# Patient Record
Sex: Female | Born: 1960 | Race: Black or African American | Hispanic: No | Marital: Single | Smoking: Current some day smoker
Health system: Southern US, Community
[De-identification: ages and names within clinical notes are randomized; demographics above are authoritative.]

## PROBLEM LIST (undated history)

## (undated) DIAGNOSIS — E079 Disorder of thyroid, unspecified: Secondary | ICD-10-CM

## (undated) DIAGNOSIS — I639 Cerebral infarction, unspecified: Secondary | ICD-10-CM

## (undated) DIAGNOSIS — R079 Chest pain, unspecified: Secondary | ICD-10-CM

## (undated) DIAGNOSIS — F319 Bipolar disorder, unspecified: Secondary | ICD-10-CM

## (undated) DIAGNOSIS — F4310 Post-traumatic stress disorder, unspecified: Secondary | ICD-10-CM

## (undated) DIAGNOSIS — I10 Essential (primary) hypertension: Secondary | ICD-10-CM

## (undated) DIAGNOSIS — K219 Gastro-esophageal reflux disease without esophagitis: Secondary | ICD-10-CM

## (undated) DIAGNOSIS — C801 Malignant (primary) neoplasm, unspecified: Secondary | ICD-10-CM

## (undated) DIAGNOSIS — F329 Major depressive disorder, single episode, unspecified: Secondary | ICD-10-CM

## (undated) DIAGNOSIS — F32A Depression, unspecified: Secondary | ICD-10-CM

## (undated) HISTORY — DX: Malignant (primary) neoplasm, unspecified: C80.1

## (undated) HISTORY — PX: TUBAL LIGATION: SHX77

## (undated) HISTORY — DX: Essential (primary) hypertension: I10

## (undated) HISTORY — DX: Gastro-esophageal reflux disease without esophagitis: K21.9

## (undated) HISTORY — PX: SALPINGOOPHORECTOMY: SHX82

## (undated) HISTORY — DX: Chest pain, unspecified: R07.9

## (undated) HISTORY — DX: Disorder of thyroid, unspecified: E07.9

---

## 2005-05-28 DIAGNOSIS — I639 Cerebral infarction, unspecified: Secondary | ICD-10-CM

## 2005-05-28 HISTORY — DX: Cerebral infarction, unspecified: I63.9

## 2006-01-15 ENCOUNTER — Encounter: Payer: Self-pay | Admitting: Internal Medicine

## 2006-01-15 ENCOUNTER — Inpatient Hospital Stay (HOSPITAL_COMMUNITY): Admission: EM | Admit: 2006-01-15 | Discharge: 2006-01-17 | Payer: Self-pay | Admitting: Emergency Medicine

## 2006-01-15 ENCOUNTER — Ambulatory Visit: Payer: Self-pay | Admitting: Internal Medicine

## 2006-01-21 ENCOUNTER — Ambulatory Visit: Payer: Self-pay | Admitting: Internal Medicine

## 2006-01-23 ENCOUNTER — Encounter: Admission: RE | Admit: 2006-01-23 | Discharge: 2006-04-16 | Payer: Self-pay | Admitting: Internal Medicine

## 2006-01-24 ENCOUNTER — Ambulatory Visit: Payer: Self-pay | Admitting: *Deleted

## 2006-02-18 ENCOUNTER — Ambulatory Visit: Payer: Self-pay | Admitting: Internal Medicine

## 2006-02-22 ENCOUNTER — Ambulatory Visit (HOSPITAL_COMMUNITY): Admission: RE | Admit: 2006-02-22 | Discharge: 2006-02-22 | Payer: Self-pay | Admitting: Internal Medicine

## 2006-03-07 ENCOUNTER — Ambulatory Visit: Payer: Self-pay | Admitting: Internal Medicine

## 2006-03-21 ENCOUNTER — Ambulatory Visit: Payer: Self-pay | Admitting: Internal Medicine

## 2006-04-04 ENCOUNTER — Encounter: Payer: Self-pay | Admitting: Internal Medicine

## 2006-04-04 ENCOUNTER — Ambulatory Visit: Payer: Self-pay | Admitting: Internal Medicine

## 2006-04-16 ENCOUNTER — Inpatient Hospital Stay (HOSPITAL_COMMUNITY): Admission: EM | Admit: 2006-04-16 | Discharge: 2006-04-19 | Payer: Self-pay | Admitting: Emergency Medicine

## 2006-04-16 ENCOUNTER — Ambulatory Visit: Payer: Self-pay | Admitting: Cardiovascular Disease

## 2006-04-17 ENCOUNTER — Encounter: Payer: Self-pay | Admitting: Cardiology

## 2006-04-24 ENCOUNTER — Ambulatory Visit: Payer: Self-pay

## 2006-04-24 ENCOUNTER — Ambulatory Visit: Payer: Self-pay | Admitting: Cardiovascular Disease

## 2006-04-26 ENCOUNTER — Inpatient Hospital Stay (HOSPITAL_BASED_OUTPATIENT_CLINIC_OR_DEPARTMENT_OTHER): Admission: RE | Admit: 2006-04-26 | Discharge: 2006-04-26 | Payer: Self-pay | Admitting: Cardiovascular Disease

## 2006-04-26 ENCOUNTER — Ambulatory Visit: Payer: Self-pay | Admitting: Cardiovascular Disease

## 2006-05-02 ENCOUNTER — Ambulatory Visit: Payer: Self-pay | Admitting: Internal Medicine

## 2006-05-07 ENCOUNTER — Ambulatory Visit: Payer: Self-pay | Admitting: Cardiovascular Disease

## 2006-05-07 ENCOUNTER — Ambulatory Visit (HOSPITAL_COMMUNITY): Admission: RE | Admit: 2006-05-07 | Discharge: 2006-05-07 | Payer: Self-pay | Admitting: Internal Medicine

## 2006-07-05 ENCOUNTER — Ambulatory Visit: Payer: Self-pay | Admitting: Internal Medicine

## 2006-07-12 ENCOUNTER — Ambulatory Visit: Payer: Self-pay | Admitting: Cardiovascular Disease

## 2006-07-16 ENCOUNTER — Ambulatory Visit: Payer: Self-pay | Admitting: Internal Medicine

## 2006-08-02 ENCOUNTER — Ambulatory Visit: Payer: Self-pay | Admitting: Internal Medicine

## 2006-08-27 ENCOUNTER — Ambulatory Visit: Payer: Self-pay | Admitting: Internal Medicine

## 2006-09-17 ENCOUNTER — Ambulatory Visit: Payer: Self-pay | Admitting: Internal Medicine

## 2006-09-19 ENCOUNTER — Ambulatory Visit (HOSPITAL_COMMUNITY): Admission: RE | Admit: 2006-09-19 | Discharge: 2006-09-19 | Payer: Self-pay | Admitting: Internal Medicine

## 2006-09-25 ENCOUNTER — Ambulatory Visit: Payer: Self-pay | Admitting: Internal Medicine

## 2006-10-11 ENCOUNTER — Ambulatory Visit: Payer: Self-pay | Admitting: Internal Medicine

## 2006-11-12 ENCOUNTER — Ambulatory Visit: Payer: Self-pay | Admitting: Internal Medicine

## 2007-02-10 ENCOUNTER — Ambulatory Visit: Payer: Self-pay | Admitting: Internal Medicine

## 2007-02-12 ENCOUNTER — Encounter (INDEPENDENT_AMBULATORY_CARE_PROVIDER_SITE_OTHER): Payer: Self-pay | Admitting: *Deleted

## 2007-03-03 ENCOUNTER — Ambulatory Visit: Payer: Self-pay | Admitting: Internal Medicine

## 2007-06-18 ENCOUNTER — Ambulatory Visit: Payer: Self-pay | Admitting: Internal Medicine

## 2007-07-03 ENCOUNTER — Ambulatory Visit: Payer: Self-pay | Admitting: Cardiovascular Disease

## 2007-09-24 ENCOUNTER — Ambulatory Visit: Payer: Self-pay | Admitting: Internal Medicine

## 2008-03-01 ENCOUNTER — Ambulatory Visit: Payer: Self-pay | Admitting: Internal Medicine

## 2008-06-24 ENCOUNTER — Ambulatory Visit: Payer: Self-pay | Admitting: Internal Medicine

## 2008-06-28 ENCOUNTER — Ambulatory Visit: Payer: Self-pay | Admitting: Cardiovascular Disease

## 2008-07-01 ENCOUNTER — Ambulatory Visit: Payer: Self-pay | Admitting: Internal Medicine

## 2008-09-08 ENCOUNTER — Ambulatory Visit: Payer: Self-pay | Admitting: Internal Medicine

## 2008-09-08 LAB — CONVERTED CEMR LAB
HDL: 41 mg/dL (ref >39)
LDL Cholesterol: 99 mg/dL (ref 0–99)
Triglycerides: 99 mg/dL (ref <150)
VLDL: 20 mg/dL (ref 0–40)

## 2008-11-12 ENCOUNTER — Ambulatory Visit: Payer: Self-pay | Admitting: Internal Medicine

## 2009-02-07 ENCOUNTER — Ambulatory Visit: Payer: Self-pay | Admitting: Internal Medicine

## 2009-03-02 ENCOUNTER — Ambulatory Visit: Payer: Self-pay | Admitting: Internal Medicine

## 2009-04-07 ENCOUNTER — Ambulatory Visit: Payer: Self-pay | Admitting: Internal Medicine

## 2009-04-07 ENCOUNTER — Other Ambulatory Visit: Admission: RE | Admit: 2009-04-07 | Discharge: 2009-04-07 | Payer: Self-pay | Admitting: Internal Medicine

## 2009-04-07 ENCOUNTER — Encounter: Payer: Self-pay | Admitting: Internal Medicine

## 2009-04-18 ENCOUNTER — Ambulatory Visit (HOSPITAL_COMMUNITY): Admission: RE | Admit: 2009-04-18 | Discharge: 2009-04-18 | Payer: Self-pay | Admitting: Family Medicine

## 2009-05-03 ENCOUNTER — Encounter: Admission: RE | Admit: 2009-05-03 | Discharge: 2009-05-03 | Payer: Self-pay | Admitting: Family Medicine

## 2009-06-02 ENCOUNTER — Encounter (INDEPENDENT_AMBULATORY_CARE_PROVIDER_SITE_OTHER): Payer: Self-pay | Admitting: *Deleted

## 2009-06-15 ENCOUNTER — Ambulatory Visit: Payer: Self-pay | Admitting: Obstetrics and Gynecology

## 2009-06-15 ENCOUNTER — Other Ambulatory Visit: Admission: RE | Admit: 2009-06-15 | Discharge: 2009-06-15 | Payer: Self-pay | Admitting: Obstetrics and Gynecology

## 2009-06-16 ENCOUNTER — Encounter: Payer: Self-pay | Admitting: Obstetrics and Gynecology

## 2009-06-16 LAB — CONVERTED CEMR LAB
Free T4: 1.07 ng/dL (ref 0.80–1.80)
T3, Free: 2.8 pg/mL (ref 2.3–4.2)

## 2009-06-22 ENCOUNTER — Ambulatory Visit: Payer: Self-pay | Admitting: Obstetrics & Gynecology

## 2009-06-29 ENCOUNTER — Ambulatory Visit: Payer: Self-pay | Admitting: Obstetrics and Gynecology

## 2009-06-29 LAB — CONVERTED CEMR LAB
HCT: 28.7 % — ABNORMAL LOW (ref 36.0–46.0)
Hemoglobin: 8.5 g/dL — ABNORMAL LOW (ref 12.0–15.0)
WBC: 8.2 10*3/uL (ref 4.0–10.5)

## 2009-07-01 ENCOUNTER — Ambulatory Visit: Payer: Self-pay | Admitting: Obstetrics & Gynecology

## 2009-07-01 DIAGNOSIS — K219 Gastro-esophageal reflux disease without esophagitis: Secondary | ICD-10-CM | POA: Insufficient documentation

## 2009-07-01 DIAGNOSIS — E0590 Thyrotoxicosis, unspecified without thyrotoxic crisis or storm: Secondary | ICD-10-CM | POA: Insufficient documentation

## 2009-07-01 DIAGNOSIS — I10 Essential (primary) hypertension: Secondary | ICD-10-CM

## 2009-07-01 DIAGNOSIS — E7800 Pure hypercholesterolemia, unspecified: Secondary | ICD-10-CM

## 2009-07-01 DIAGNOSIS — R002 Palpitations: Secondary | ICD-10-CM

## 2009-07-01 DIAGNOSIS — R079 Chest pain, unspecified: Secondary | ICD-10-CM

## 2009-07-04 ENCOUNTER — Ambulatory Visit: Payer: Self-pay | Admitting: Obstetrics & Gynecology

## 2009-07-04 DIAGNOSIS — E785 Hyperlipidemia, unspecified: Secondary | ICD-10-CM

## 2009-07-04 LAB — CONVERTED CEMR LAB
HCT: 28.9 % — ABNORMAL LOW (ref 36.0–46.0)
Hemoglobin: 8.5 g/dL — ABNORMAL LOW (ref 12.0–15.0)
RDW: 22.1 % — ABNORMAL HIGH (ref 11.5–15.5)

## 2009-07-05 ENCOUNTER — Ambulatory Visit: Payer: Self-pay | Admitting: Cardiovascular Disease

## 2009-07-05 DIAGNOSIS — F17200 Nicotine dependence, unspecified, uncomplicated: Secondary | ICD-10-CM | POA: Insufficient documentation

## 2009-07-15 ENCOUNTER — Ambulatory Visit: Payer: Self-pay | Admitting: Family

## 2009-07-15 ENCOUNTER — Inpatient Hospital Stay (HOSPITAL_COMMUNITY): Admission: AD | Admit: 2009-07-15 | Discharge: 2009-07-15 | Payer: Self-pay | Admitting: Obstetrics & Gynecology

## 2009-07-21 ENCOUNTER — Ambulatory Visit: Admission: RE | Admit: 2009-07-21 | Discharge: 2009-07-21 | Payer: Self-pay | Admitting: Gynecologic Oncology

## 2009-07-25 ENCOUNTER — Inpatient Hospital Stay: Admission: AD | Admit: 2009-07-25 | Discharge: 2009-07-25 | Payer: Self-pay | Admitting: Obstetrics & Gynecology

## 2009-07-26 HISTORY — PX: ABDOMINAL HYSTERECTOMY: SHX81

## 2009-07-28 ENCOUNTER — Ambulatory Visit: Payer: Self-pay | Admitting: Internal Medicine

## 2009-08-16 ENCOUNTER — Encounter: Payer: Self-pay | Admitting: Obstetrics & Gynecology

## 2009-08-17 ENCOUNTER — Inpatient Hospital Stay (HOSPITAL_COMMUNITY): Admission: RE | Admit: 2009-08-17 | Discharge: 2009-08-18 | Payer: Self-pay | Admitting: Obstetrics & Gynecology

## 2009-08-22 ENCOUNTER — Emergency Department (HOSPITAL_COMMUNITY): Admission: EM | Admit: 2009-08-22 | Discharge: 2009-08-23 | Payer: Self-pay | Admitting: Emergency Medicine

## 2009-09-05 ENCOUNTER — Ambulatory Visit: Payer: Self-pay | Admitting: Internal Medicine

## 2009-09-13 ENCOUNTER — Ambulatory Visit: Admission: RE | Admit: 2009-09-13 | Discharge: 2009-09-13 | Payer: Self-pay | Admitting: Gynecologic Oncology

## 2009-09-27 ENCOUNTER — Ambulatory Visit: Payer: Self-pay | Admitting: Internal Medicine

## 2009-09-29 ENCOUNTER — Encounter (INDEPENDENT_AMBULATORY_CARE_PROVIDER_SITE_OTHER): Payer: Self-pay | Admitting: Internal Medicine

## 2009-12-26 ENCOUNTER — Ambulatory Visit: Payer: Self-pay | Admitting: Internal Medicine

## 2009-12-26 LAB — CONVERTED CEMR LAB
ALT: 15 units/L (ref 0–35)
AST: 18 units/L (ref 0–37)
Alkaline Phosphatase: 75 units/L (ref 39–117)
Basophils Absolute: 0 10*3/uL (ref 0.0–0.1)
Basophils Relative: 0 % (ref 0–1)
Chloride: 108 meq/L (ref 96–112)
Creatinine, Ser: 1.01 mg/dL (ref 0.40–1.20)
Eosinophils Absolute: 0.1 10*3/uL (ref 0.0–0.7)
Eosinophils Relative: 1 % (ref 0–5)
Hemoglobin: 11.5 g/dL — ABNORMAL LOW (ref 12.0–15.0)
MCHC: 30.7 g/dL (ref 30.0–36.0)
MCV: 79.6 fL (ref 78.0–100.0)
Monocytes Absolute: 0.4 10*3/uL (ref 0.1–1.0)
Neutro Abs: 3.6 10*3/uL (ref 1.7–7.7)
RDW: 22.3 % — ABNORMAL HIGH (ref 11.5–15.5)
Total Bilirubin: 0.3 mg/dL (ref 0.3–1.2)

## 2010-01-05 ENCOUNTER — Ambulatory Visit: Admission: RE | Admit: 2010-01-05 | Discharge: 2010-01-05 | Payer: Self-pay | Admitting: Gynecologic Oncology

## 2010-03-29 ENCOUNTER — Ambulatory Visit: Payer: Self-pay | Admitting: Internal Medicine

## 2010-03-29 ENCOUNTER — Encounter (INDEPENDENT_AMBULATORY_CARE_PROVIDER_SITE_OTHER): Payer: Self-pay | Admitting: Internal Medicine

## 2010-03-29 LAB — CONVERTED CEMR LAB
CO2: 21 meq/L (ref 19–32)
Calcium: 9.3 mg/dL (ref 8.4–10.5)
Chloride: 107 meq/L (ref 96–112)
Creatinine, Ser: 1 mg/dL (ref 0.40–1.20)
Eosinophils Relative: 2 % (ref 0–5)
Folate: >20 ng/mL
Glucose, Bld: 82 mg/dL (ref 70–99)
HCT: 40.9 % (ref 36.0–46.0)
HDL: 40 mg/dL (ref >39)
Hemoglobin: 13.3 g/dL (ref 12.0–15.0)
LDL Cholesterol: 146 mg/dL — ABNORMAL HIGH (ref 0–99)
Lymphocytes Relative: 41 % (ref 12–46)
Lymphs Abs: 2.5 10*3/uL (ref 0.7–4.0)
Monocytes Absolute: 0.4 10*3/uL (ref 0.1–1.0)
Neutro Abs: 3.2 10*3/uL (ref 1.7–7.7)
RBC: 5.12 M/uL — ABNORMAL HIGH (ref 3.87–5.11)
Saturation Ratios: 27 % (ref 20–55)
Sodium: 142 meq/L (ref 135–145)
TIBC: 350 ug/dL (ref 250–470)
Total CHOL/HDL Ratio: 5.5
Vitamin B-12: 502 pg/mL (ref 211–911)
WBC: 6.2 10*3/uL (ref 4.0–10.5)

## 2010-05-17 ENCOUNTER — Ambulatory Visit: Payer: Self-pay | Admitting: Obstetrics and Gynecology

## 2010-06-27 NOTE — Letter (Signed)
Summary: Appointment - Reminder 2  [employer], Main Office  [address]   [place], [place] [address]   Phone: [phone]  Fax: [phone]     June 02, 2009 MRN: [id number]   [place] [address] [place], [place]  [address]   Dear Ms. Cloyd,  Our records indicate that it is time to schedule a follow-up appointment with Dr. Eden Emms. It is very important that we reach you to schedule this appointment. We look forward to participating in your health care needs. Please contact us at the number listed above at your earliest convenience to schedule your appointment.  If you are unable to make an appointment at this time, give [place] a call so we can update our records.  Sincerely,   [facility] Scheduling Team

## 2010-06-27 NOTE — Assessment & Plan Note (Signed)
Summary: f1y/dm  Medications Added CLONIDINE HCL 0.1 MG TABS (CLONIDINE HCL) TAKE ONE (1) TABLET BY MOUTH TWO (2) TIMES DAILY HYDROCHLOROTHIAZIDE 25 MG TABS (HYDROCHLOROTHIAZIDE) TAKE ONE TABLET BY MOUTH EVERY MORNING TOPAMAX 50 MG TABS (TOPIRAMATE) 1 tab by mouth two times a day SIMVASTATIN 20 MG TABS (SIMVASTATIN) Take one tablet by mouth daily at bedtime MIRTAZAPINE 30 MG TABS (MIRTAZAPINE) 1 tab by mouth once daily ASPIRIN EC 325 MG TBEC (ASPIRIN) Take one tablet by mouth daily CYMBALTA 30 MG CPEP (DULOXETINE HCL) 1 tab by mouth once daily SEROQUEL 300 MG TABS (QUETIAPINE FUMARATE) 1 tab by mouth once daily CLONAZEPAM 1 MG TABS (CLONAZEPAM) 1 tab by mouth three times a day TAZTIA XT 360 MG XR24H-CAP (DILTIAZEM HCL ER BEADS) 1 tab by mouth once daily VENTOLIN HFA 108 (90 BASE) MCG/ACT AERS (ALBUTEROL SULFATE) as needed ACIPHEX 20 MG TBEC (RABEPRAZOLE SODIUM) 1 tab by mouth once daily * ASTHMA PUMP as directed        CC:  pt has recently been diagnosed with asthma .  History of Present Illness:  This is a 50 year old patient previouslyseen for hypertension, hypercholesterolemia, atypical chest pain, andpalpitations.  The patient seems a little disoriented today.  She hassignificant psychiatric issue.  Her history is hard to follow.  She has followed up with her psychiatrist at [facility] center and with adjustment in her medicine for panic attacks her SSCP has gone away.  She continues to smoke less than a ppd.  I conselled her for less than 10 minutes regarding cessation.  She would be a better candidate for nicotine replacement with no known CAD and multiple other psychotropic meds on board.   She apparantly has uterine cancer and may need a hysterectomy. From a cardiac perspective she is clear to have this  Current Problems (verified): 1)  Chest Pain  (ICD-786.50) 2)  Hypertension  (ICD-401.9) 3)  Hyperthyroidism  (ICD-242.90) 4)  Hyperlipidemia  (ICD-272.4) 5)  Palpitations   (ICD-785.1) 6)  Hypercholesterolemia  (ICD-272.0) 7)  Gerd  (ICD-530.81)  Current Medications (verified): 1)  Clonidine Hcl 0.1 Mg Tabs (Clonidine Hcl) .... Take One (1) Tablet By Mouth Two (2) Times Daily 2)  Hydrochlorothiazide 25 Mg Tabs (Hydrochlorothiazide) .... Take One Tablet By Mouth Every Morning 3)  Topamax 50 Mg Tabs (Topiramate) .Marland Kitchen.. 1 Tab By Mouth Two Times A Day 4)  Simvastatin 20 Mg Tabs (Simvastatin) .... Take One Tablet By Mouth Daily At Bedtime 5)  Mirtazapine 30 Mg Tabs (Mirtazapine) .Marland Kitchen.. 1 Tab By Mouth Once Daily 6)  Aspirin Ec 325 Mg Tbec (Aspirin) .... Take One Tablet By Mouth Daily 7)  Cymbalta 30 Mg Cpep (Duloxetine Hcl) .Marland Kitchen.. 1 Tab By Mouth Once Daily 8)  Seroquel 300 Mg Tabs (Quetiapine Fumarate) .Marland Kitchen.. 1 Tab By Mouth Once Daily 9)  Clonazepam 1 Mg Tabs (Clonazepam) .Marland Kitchen.. 1 Tab By Mouth Three Times A Day 10)  Taztia Xt 360 Mg Xr24h-Cap (Diltiazem Hcl Er Beads) .Marland Kitchen.. 1 Tab By Mouth Once Daily 11)  Ventolin Hfa 108 (90 Base) Mcg/act Aers (Albuterol Sulfate) .... As Needed 12)  Aciphex 20 Mg Tbec (Rabeprazole Sodium) .Marland Kitchen.. 1 Tab By Mouth Once Daily 13)  Asthma Pump .... 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 Left thalamic stroke.  Past Surgical History: Last updated: 07/27/2009 Bilateral tubal ligation.  Family History: Last updated: 2009-07-27  Mother died of CHF in 2005/10/03.  Father died of CAD.  She has 1 sister with diabetes and 5 other brothers in good health.  Social History: Last updated: July 27, 2009  The patient lives in [place] with her boyfriend.  She is  unemployed.   She has 2 sons.  The patient is a 20 pack year tobacco abuse.  Negative for ETOH or illicit drug use.  She does go to physical therapy  secondary to CVA with left-sided hemiparesis.  No herbal medicine use.  Review of Systems       Denies fever, malais, weight loss, blurry vision, decreased visual acuity, cough, sputum, SOB, hemoptysis, pleuritic pain, palpitaitons, heartburn, abdominal pain, melena, lower extremity edema, claudication, or rash. All other systems reviewed and negative  Vital Signs:  Patient profile:   50 year old female Height:      64 inches Weight:      124 pounds BMI:     21.36 Pulse rate:   73 / minute Resp:     12 per minute BP sitting:   141 / 77  (left arm)  Vitals Entered By: Kem Parkinson (July 05, 2009 4:03 PM)  Physical Exam  General:  Affect appropriate Healthy:  appears stated age HEENT: normal Neck supple with no adenopathy JVP normal no bruits no thyromegaly Lungs clear with no wheezing and good diaphragmatic motion Heart:  S1/S2 no murmur,rub, gallop or click PMI normal Abdomen: benighn, BS positve, no tenderness, no AAA no bruit.  No HSM or HJR Distal pulses intact with no bruits No edema Neuro non-focal Skin warm and dry    Impression & Recommendations:  Problem # 1:  CHEST PAIN (ICD-786.50) Resolved with relationship to panic attacks Her updated medication list for this problem includes:    Aspirin Ec 325 Mg Tbec (Aspirin) .Marland Kitchen... Take one tablet by mouth daily    Taztia Xt 360 Mg Xr24h-cap (Diltiazem hcl er beads) .Marland Kitchen... 1 tab by mouth once daily  Problem # 2:  HYPERTENSION (ICD-401.9) Well cotnrolled Her updated medication list for this problem includes:    Clonidine Hcl 0.1 Mg Tabs (Clonidine hcl) .Marland Kitchen... Take one (1) tablet by mouth two (2) times daily    Hydrochlorothiazide 25 Mg Tabs (Hydrochlorothiazide) .Marland Kitchen... Take one tablet by mouth every morning    Aspirin Ec 325 Mg Tbec (Aspirin) .Marland Kitchen... Take one tablet by mouth  daily    Taztia Xt 360 Mg Xr24h-cap (Diltiazem hcl er beads) .Marland Kitchen... 1 tab by mouth once daily  Problem # 3:  HYPERLIPIDEMIA (ICD-272.4) At target with no side effects Her updated medication list for this problem includes:    Simvastatin 20 Mg Tabs (Simvastatin) .Marland Kitchen... Take one tablet by mouth daily at bedtime  CHOL: 160 (09/08/2008)   LDL: 99 (09/08/2008)   HDL: 41 (09/08/2008)   TG: 99 (09/08/2008)  Problem # 4:  SMOKER (ICD-305.1) Discuss with Dr. Reche Dixon nicotine replacement Rx

## 2010-07-12 ENCOUNTER — Ambulatory Visit: Payer: Medicare Other | Attending: Gynecologic Oncology | Admitting: Gynecologic Oncology

## 2010-07-12 DIAGNOSIS — I10 Essential (primary) hypertension: Secondary | ICD-10-CM | POA: Insufficient documentation

## 2010-07-12 DIAGNOSIS — C549 Malignant neoplasm of corpus uteri, unspecified: Secondary | ICD-10-CM | POA: Insufficient documentation

## 2010-07-12 DIAGNOSIS — E039 Hypothyroidism, unspecified: Secondary | ICD-10-CM | POA: Insufficient documentation

## 2010-07-12 DIAGNOSIS — J4489 Other specified chronic obstructive pulmonary disease: Secondary | ICD-10-CM | POA: Insufficient documentation

## 2010-07-12 DIAGNOSIS — E785 Hyperlipidemia, unspecified: Secondary | ICD-10-CM | POA: Insufficient documentation

## 2010-07-12 DIAGNOSIS — K219 Gastro-esophageal reflux disease without esophagitis: Secondary | ICD-10-CM | POA: Insufficient documentation

## 2010-07-12 DIAGNOSIS — J449 Chronic obstructive pulmonary disease, unspecified: Secondary | ICD-10-CM | POA: Insufficient documentation

## 2010-07-13 NOTE — Consult Note (Signed)
  NAMEMARRA, FRAGA               ACCOUNT NO.:  [id number]  MEDICAL RECORD NO.:  [id number]          PATIENT TYPE:  WOC  LOCATION:  WOC                          FACILITY:  [facility]  PHYSICIAN:  Laurette Schimke, MD     DATE OF BIRTH:  09-Dec-1960  DATE OF CONSULTATION: DATE OF DISCHARGE:  05/17/2010                                CONSULTATION   REASON FOR VISIT:  Surveillance for stage IA endometrial cancer.  HISTORY OF PRESENT ILLNESS:  This is a 50 year old female, diagnosed with a grade I endometrial cancer on August 16, 2009, she underwent laparoscopic hysterectomy and bilateral salpingo-oophorectomy.  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  Ms. Bromwell was seen in the resident's clinic approximately 3 months ago, at which time she states that a discussion was held with her and she was given medications for hot flashes.  She denies any nausea, vomiting, abdominal pain, or vaginal discharge.  PAST MEDICAL HISTORY:  Stage IA grade 2 endometrioid adenocarcinoma, hypertension, dyslipidemia, COPD, lacunar infarct of the left limb of the internal capsule, migraine headaches, depression, gastroesophageal reflux disease, and hypothyroidism.  PAST SURGICAL HISTORY:  Laparoscopic bilateral salpingo-oophorectomy and hysterectomy in March 2011, right breast cyst aspiration in 1990s, bilateral tubal ligation in 1980s, and cardiac catheterization in 2008.  PAST GYN HISTORY:  No interval changes.  FAMILY HISTORY:  Unchanged.  SOCIAL HISTORY:  Ms. Tarman continues to smoke 1-pack per day without a plan for tobacco cessation.  SCREENING HISTORY:  Mammogram in 2011.  REVIEW OF SYSTEMS:  Ten point review of system was notable for no acute changes in her well-being.  PHYSICAL EXAMINATION:  GENERAL:  Well-developed, thin female, in no acute distress, weight 137 pounds. VITAL SIGNS:  Blood pressure 138/82, pulse of 80. CHEST:  Clear to  auscultation. ABDOMEN:  Soft, nontender.  Laparoscopic port sites showed no evidence of a hernia or masses. LYMPH NODES:  No cervical, supraclavicular or inguinal adenopathy. PELVIC:  Normal external genitalia.  Bartholin, urethral, and Skene. The vagina is atrophic, no lesions are appreciated. RECTAL:  Good anal sphincter tone without any masses.  IMPRESSION:  Ms. Behler is a 50 year old with stage IA, grade 2 endometrial adenocarcinoma.  [name] needs for surveillance are as follows:  Evaluation every 6 months.  At the 50-month evaluation, speculum examination and visual inspection of the vagina.  I will leave Pap test collection to the general GYN team.  Pap tests are not required for surveillance more frequently than every year.  Since the sensitivity of cytology only in detecting reoccurrence varies between 0 to 6.8%.  I have asked Ms. Denes to follow up with Dr. Okey Dupre in 3 months and follow up with GYN/Oncology in 6 months.     Laurette Schimke, MD     WB/MEDQ  D:  07/12/2010  T:  07/13/2010  Job:  [id number]  cc:   Javier Glazier. Okey Dupre, M.D.  Dineen Kid Reche Dixon, M.D. Fax: [phone]  Telford Nab, R.N. [address] [place], [place] [address]  Electronically Signed by Laurette Schimke MD on 07/13/2010 12:49:52 PM

## 2010-08-17 LAB — CBC
HCT: 31.1 % — ABNORMAL LOW (ref 36.0–46.0)
Hemoglobin: 9.6 g/dL — ABNORMAL LOW (ref 12.0–15.0)
MCHC: 30.7 g/dL (ref 30.0–36.0)
MCHC: 30.7 g/dL (ref 30.0–36.0)
MCV: 70.2 fL — ABNORMAL LOW (ref 78.0–100.0)
MCV: 72 fL — ABNORMAL LOW (ref 78.0–100.0)
RBC: 4.43 MIL/uL (ref 3.87–5.11)
RDW: 23.7 % — ABNORMAL HIGH (ref 11.5–15.5)
RDW: 25.5 % — ABNORMAL HIGH (ref 11.5–15.5)

## 2010-08-17 LAB — SAMPLE TO BLOOD BANK

## 2010-08-21 LAB — DIFFERENTIAL
Basophils Relative: 0 % (ref 0–1)
Eosinophils Relative: 0 % (ref 0–5)
Lymphs Abs: 2.2 10*3/uL (ref 0.7–4.0)
Monocytes Absolute: 0.4 10*3/uL (ref 0.1–1.0)

## 2010-08-21 LAB — URINALYSIS, ROUTINE W REFLEX MICROSCOPIC
Hgb urine dipstick: NEGATIVE
Nitrite: NEGATIVE
Specific Gravity, Urine: 1.023 (ref 1.005–1.030)
Urobilinogen, UA: 0.2 mg/dL (ref 0.0–1.0)

## 2010-08-21 LAB — CBC
Hemoglobin: 9.8 g/dL — ABNORMAL LOW (ref 12.0–15.0)
MCHC: 30 g/dL (ref 30.0–36.0)
MCV: 71 fL — ABNORMAL LOW (ref 78.0–100.0)
MCV: 71.8 fL — ABNORMAL LOW (ref 78.0–100.0)
RBC: 4.35 MIL/uL (ref 3.87–5.11)
RBC: 4.59 MIL/uL (ref 3.87–5.11)
RDW: 25.7 % — ABNORMAL HIGH (ref 11.5–15.5)
WBC: 12.2 10*3/uL — ABNORMAL HIGH (ref 4.0–10.5)

## 2010-08-21 LAB — RAPID URINE DRUG SCREEN, HOSP PERFORMED
Amphetamines: NOT DETECTED
Tetrahydrocannabinol: POSITIVE — AB

## 2010-08-21 LAB — BASIC METABOLIC PANEL
Chloride: 111 mEq/L (ref 96–112)
Creatinine, Ser: 0.96 mg/dL (ref 0.4–1.2)
GFR calc Af Amer: >60 mL/min (ref >60)
Potassium: 4.7 mEq/L (ref 3.5–5.1)

## 2010-08-21 LAB — POCT I-STAT, CHEM 8
BUN: 15 mg/dL (ref 6–23)
Calcium, Ion: 1.17 mmol/L (ref 1.12–1.32)
Chloride: 116 mEq/L — ABNORMAL HIGH (ref 96–112)
Creatinine, Ser: 0.9 mg/dL (ref 0.4–1.2)
TCO2: 18 mmol/L (ref 0–100)

## 2010-08-21 LAB — ABO/RH: ABO/RH(D): B POS

## 2010-08-21 LAB — GLUCOSE, CAPILLARY: Glucose-Capillary: 160 mg/dL — ABNORMAL HIGH (ref 70–99)

## 2010-08-21 LAB — COMPREHENSIVE METABOLIC PANEL
ALT: 11 U/L (ref 0–35)
AST: 18 U/L (ref 0–37)
Albumin: 3.9 g/dL (ref 3.5–5.2)
Calcium: 9.5 mg/dL (ref 8.4–10.5)
GFR calc Af Amer: >60 mL/min (ref >60)
Sodium: 141 mEq/L (ref 135–145)
Total Protein: 7.3 g/dL (ref 6.0–8.3)

## 2010-08-21 LAB — TYPE AND SCREEN

## 2010-08-21 LAB — URINE MICROSCOPIC-ADD ON

## 2010-08-21 LAB — SALICYLATE LEVEL: Salicylate Lvl: <4 mg/dL (ref 2.8–20.0)

## 2010-08-21 LAB — ETHANOL: Alcohol, Ethyl (B): <5 mg/dL (ref 0–10)

## 2010-08-21 LAB — ACETAMINOPHEN LEVEL: Acetaminophen (Tylenol), Serum: <10 ug/mL — ABNORMAL LOW (ref 10–30)

## 2010-10-10 NOTE — Assessment & Plan Note (Signed)
[facility]                            CARDIOLOGY OFFICE NOTE   [name]                        MRN:          [id number]  DATE:06/28/2008                            DOB:          29-Aug-1960    HISTORY OF PRESENT ILLNESS:  This is a 50 year old patient previously  seen for hypertension, hypercholesterolemia, atypical chest pain, and  palpitations.  The patient seems a little disoriented today.  She has  significant psychiatric issue.  Her history is hard to follow.  She  seems to have disorganized thoughts.  Last time I saw her, I encouraged  her to follow up with her psychologist to simplify her medical regimen,  but she continues to be on Seroquel, clonazepam, and Cymbalta.   The patient has occasional palpitations, they appear benign.  She has  not had any syncope or associated chest pain or diaphoresis with them.  She says she has been compliant with her medications.  She has not had a  recent liver and lipid test as far as I can tell and needs to follow up  with a primary care MD for this.   The patient is currently not working.  She lives at home with a friend  that is not related to her, named [name].  She does have a cousin in  [place], but is otherwise by herself.   REVIEW OF SYSTEMS:  Otherwise negative.   ALLERGIES:  She is allergic PENICILLIN.   MEDICATIONS:  She is on  1. Lisinopril 40 a day.  2. Aspirin daily.  3. HCTZ 25 a day,  4. Clonidine 0.1 b.i.d.  5. Protonix 40 a day.  6. Seroquel 300 a day.  7. Ventolin p.r.n.  8. Lipitor 10 a day.  9. Clonazepam 0.5 t.i.d.  10.Tiazac 240 a day.  11.Cymbalta 30 a day.   PHYSICAL EXAMINATION:  GENERAL:  Remarkable for a somewhat lethargic  female who speaks slowly.  Affect is somewhat down.  VITAL SIGNS:  Weight is 140, blood pressure 120/68, pulse 90 and  regular, respiratory rate 14, and afebrile.  HEENT:  Unremarkable.  NECK:  Carotids are normal without  bruit.  No lymphadenopathy,  thyromegaly, or JVP elevation.  LUNGS:  Clear.  Good diaphragmatic motion.  No wheezing.  S1 and S2.  Normal heart sounds.  PMI normal.  ABDOMEN:  Benign.  Bowel sounds positive.  No AAA, no tenderness, no  bruit, no hepatosplenomegaly, and no hepatojugular reflux.  EXTREMITIES:  Distal pulses are intact.  No edema.  NEUROLOGIC:  Nonfocal.  SKIN:  Warm and dry.  MUSCULOSKELETAL:  No muscular weakness.   EKG is totally normal.  QT interval is 366.   IMPRESSION:  1. Palpitations benign.  No evidence structural heart disease, heart      cath April 26, 2006 showing no coronary artery disease with      normal left ventricular function.  2. Hypertension, currently well controlled.  Continue current dose      lisinopril, low-sodium diet.  3. Hypercholesterolemia.  Continue Lipitor.  Followup liver profile      per  primary care MD.  4. Psychiatric illness.  I am not sure what her axis code diagnosis      is, but she has regular followup.  She says at the [facility].  She clearly has a fairly profound sedating effect from      some of her medications including the clonazepam.  Her thought      seemed more disorganized today.  I encouraged her to follow up with      her psychiatrist.  5. History of reflux.  Continue Protonix.  Symptoms seems to be      improved.  6. Apparent history of asthmatic bronchitis.  Continue p.r.n.      Ventolin.  Overall, I think the patient's cardiac status is stable.      I will see her back in a year.     Karina Pick. Eden Emms, MD, [facility]  Electronically Signed    PCN/MedQ  DD: 06/28/2008  DT: 06/29/2008  Job #: [id number]

## 2010-10-10 NOTE — Assessment & Plan Note (Signed)
[facility]                            CARDIOLOGY OFFICE NOTE   JOLINE, ENCALADA                        MRN:          [id number]  DATE:07/03/2007                            DOB:          1960/07/09    Ms. Ikeda returns today for follow-up.  She has had previous and ongoing  chest pain.  She had a heart cath done April 26, 2006 which showed no  significant coronary artery disease, good LV function, normal LV  pressures.   The patient has had hypertensive urgency at the time of her  catheterization.  We shot her renals and they were normal.  Part of her  problem has to do with compliance.  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  Since I last saw her, some of her  centrally acting agents have been changed.  She is now on Lexapro and  clonazepam.  She continues to have atypical chest pain.  She says that  she has sharp, stabbing pains in the middle of her chest that can  radiate to the back.  There is no associated diaphoresis.  She does have  some shortness of breath.  She has a problem lying flat.  It took me  quite some time to get her to understand I needed to know why she could  not lie on her back in regard to a symptom.  She finally said she had  back pain.  There was no evidence of PND or orthopnea.   The patient has had right atrial enlargement on EKG.  I suspect that  this is mostly due to her body habitus.  We will have to look back  through the records to see if she has had an echocardiogram to rule out  significant right-sided abnormalities.   REVIEW OF SYSTEMS:  Otherwise remarkable for occasional cough.  She  continues to smoke a pack a day.  There is been no fever or sputum  production, otherwise negative.   CURRENT MEDICATIONS:  1. Aspirin daily.  2. Hydrochlorothiazide 25 daily.  3. Clonidine 0.1 b.i.d.,  4. Protonix 40.  5. Lisinopril 40.  6. Atenolol 25 b.i.d.  7. Topamax 50 daily.  8. Seroquel 300  daily.  9. Lexapro 20 daily.  10.Calan 120 daily.  11.Ventolin.  12.Lipitor 10 daily.  13.Clonazepam 0.5 t.i.d.   EXAM:  Remarkable for an anxious and somewhat disoriented black female  in no distress.  Weight is 123, blood pressure is 140/80, pulse 88 and regular,  respiratory rate 14.  HEENT:  Unremarkable.  Carotids normal without bruit, no lymphadenopathy, thyromegaly, or JVP  elevation.  LUNGS:  Relatively clear with occasional rhonchi, no active wheezing,  good diaphragmatic motion.  S1-S2 normal heart sounds.  No RV heave.  No murmur.  ABDOMEN:  Benign.  Bowel sounds positive, no bruit, no tenderness, no  hepatosplenomegaly or hepatojugular reflux.  Distal pulses are intact, no edema.  NEURO:  Nonfocal.  SKIN:  Warm and dry.  No muscular weakness.   EKG shows sinus rhythm with right atrial enlargement.   IMPRESSION:  1.  Atypical chest pain, cath November 2007, no significant coronary      disease.  Her pain sounds musculoskeletal and she does not need      further workup.  2. Right atrial enlargement on EKG.  Consider follow-up      echocardiogram, no evidence of pulmonary hypertension on exam.  3. Hypertension, multiple medications.  No renal artery stenosis by      cath.  I spent less than 10 minutes counseling her to quit given      her multitude of centrally acting agents, I would not start her on      Chantix.  Follow up with Dr. Reche Dixon.  4. Psychoactive disease, on multiple medications including Seroquel      and Lexapro, clonazepam and Topamax.  It may be worthwhile for her      to seek psychiatric care in regard to simplifying these      medications.  5. Hypercholesterolemia.  Continue Lipitor 10 mg a day, lipid and      liver profile at [facility] in 6 months.  6. History of reflux.  Continue Protonix 40 a day, low spice diet,      avoid late-night meals.   We will see her back in a year.     [name]. Eden Emms, MD, [facility]  Electronically Signed     PCN/MedQ  DD: 07/03/2007  DT: 07/04/2007  Job #: [id number]   cc:   Dineen Kid. Reche Dixon, M.D.

## 2010-10-13 NOTE — H&P (Signed)
[facility] ADMISSION   [name], [name]                        MRN:          [id number]  DATE:04/24/2006                            DOB:          [birth year]    Wednesday, April 24, 2006   Ms. [name] was in the office today for her Myoview study.  She had an  Adenosine test.  She got fairly sick during this study with prolonged  hypotension and a junctional rhythm.   She recovered in about 30 minutes with hydration.  Atropine was not  given.  I reviewed the patient's Myoview study.  Although there was no a  regional distribution abnormality, there was clear LV cavity dilatation  during the chemical stress test.   The patient has been having chest pain since April.  Given the LV cavity  dilatation and the risk factors, I think a diagnostic catheterization is  in order.   MEDICATIONS:  1. Aspirin daily.  2. Hydrochlorothiazide 25 a day.  3. Lopressor 50 b.i.d.  4. Clonidine 0.1 b.i.d.  5. Zocor 20 a day.   ALLERGIES:  PENICILLIN.   PAST MEDICAL HISTORY:  1. Hypertensive left thalamic stroke.  2. Uncontrolled hypertension.  3. Tobacco abuse.  4. Hyperlipidemia.  5. History of thyroid disease.   I last saw her in the emergency room on April 16, 2006, when she was  diagnosed with left-sided hemiparesis and a thalamic stroke.   PHYSICAL EXAMINATION:  VITAL SIGNS:  Blood pressure is now 130/70, pulse  70 and regular.  HEENT:  Normal.  She has no focal deficits.  NECK:  There is no lymphadenopathy, no carotid bruits, no thyromegaly.  LUNGS:  Clear.  CARDIOVASCULAR:  There is an S1 and S2 with normal heart sounds.  ABDOMEN:  Benign.  There are no renal bruits.  EXTREMITIES:  Distal pulses are intact with no edema.   Her baseline EKG shows sinus rhythm with LVH in the limb leads and poor  R-wave progression.   IMPRESSION:  Recurrent chest pain since April with significant cardiac  risk factors  and left ventricular cavity dilatation on adenosine  Myoview.  Diagnostic catheterization will be performed Friday morning.   Risks of catheterization including stroke, dye reaction, need for  emergency surgery and bleeding were discussed.  She is willing to  proceed.   Further recommendations will be based on the results of her  catheterization.     [name]. Eden Emms, MD, [facility]  Electronically Signed    PCN/MedQ  DD: 04/24/2006  DT: 04/24/2006  Job #: [id number]

## 2010-10-13 NOTE — H&P (Signed)
[name], [name]               ACCOUNT NO.:  [id number]   MEDICAL RECORD NO.:  [id number]          PATIENT TYPE:  INP   LOCATION:  3021                         FACILITY:  [facility]   PHYSICIAN:  Casimiro Needle L. Reynolds, M.D.DATE OF BIRTH:  07-14-1960   DATE OF ADMISSION:  04/16/2006  DATE OF DISCHARGE:                                HISTORY & PHYSICAL   CHIEF COMPLAINT:  Headache and left-sided numbness.   HISTORY OF PRESENT ILLNESS:  This is the second [facility] since  admission and the first Stroke Service admission for this 50 year old woman  with a past medical history which includes hypertension and a stroke in  August of this year for which she was seen by the Stroke Consult Service,  but she had a right hemiparesis at that time.  The patient reports that over  the last 2 or 3 weeks she has had gradually worsening headaches.  She says  she has a lifelong history of headaches, but these over the past few weeks  have been more severe and persistent.  She has also noticed over that time  that her blood pressure has been high when it has been checked at her  physical therapy sessions or when she has been out at the drugstore.  This  morning she awoke with a particularly severe headache which she describes as  global and associated with nausea, vomiting, and photophobia.  She also  noticed upon awakening that her left side did not feel right; it felt  funny and tight, and she noted that she leaned to the left when she  tried to walk.  These were new symptoms for her.  Her family brought her to  the [facility] Emergency Department where she was found to have a  significantly elevated blood pressure.  She was given clonidine, Dilaudid,  and Zofran.  She continues to report the left-sided symptoms, although her  headache is somewhat improved.  When the focal weakness was noted in the  emergency room, a neurologic consultation for admission was subsequently  requested.   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  She was admitted to the hospital service with a stroke,  service consulting.  Her workup at that time was negative except for  hypertension and mild elevation of her LDL and she was discharged on  aspirin, Statin, and blood pressure medications.  She said that she has been  medically compliant since that time.  She has a longstanding history of  hypertension.  She also reports a long history of headaches going back to  childhood, although she has not been diagnosed with a headache disorder.  She also has a history of bilateral tubal ligation.   FAMILY HISTORY:  Her mother died in 2022-10-03 of CHF.   SOCIAL HISTORY:  She is normally independent of her activities of daily  living, although she has been getting some outpatient physical therapy which  she just finished this week.  She smokes a half pack of cigarettes a day.   ALLERGIES:  PENICILLIN.   MEDICATIONS:  Doses of her medications are uncertain as her list is not  available.  According to her medication sheet, she takes clonidine, HCTZ,  atenolol, pravastatin, lisinopril.  She has a prescription for Naprosyn but  states she has only taken this a couple of times and not in the past couple  of weeks.   REVIEW OF SYSTEMS:  The patient says that she has had intermittent chest  pain dating back to April of last year at the time of her mother's death.  She had two particularly severe episodes last week which were associated  with shortness of breath and diaphoresis.  One of these occurred while she  was riding in a truck and the other occurred after therapy.  She has a long  history of the headaches as noted above.  Full 10 systems of review of  systems is otherwise negative except as outlined  in the HPI and in the  admission nursing record.   PHYSICAL EXAM:  VITAL SIGNS:   Temperature 98.1, blood pressure 185/104,  pulse 76, respirations 16.  GENERAL EXAMINATION:  This is thin but otherwise healthy-appearing woman,  supine and no evidence of distress.  HEENT:  Head:  Normocephalic, atraumatic.  Oropharynx is benign.  NECK:  Supple without carotid or supraclavicular bruits.  CHEST:  Clear to auscultation bilaterally.  HEART:  Regular rate and rhythm without murmurs.  ABDOMEN:  Soft.  Normoactive bowel sounds.  No hepatomegaly.  EXTREMITIES:  No edema, 2+ pulses.  NEUROLOGICAL EXAM:  Mental status:  She is awake, alert, and fully oriented  to time, place, and person.  Recent and remote memory are intact.  Attention  span, concentration, and fund of knowledge were all appropriate.  Her speech  is minimally dysarthric but entirely fluent and appropriate.  Cranial  nerves:  Pupils are equal and briskly reactive.  Extraocular movements full  without nystagmus.  Visual fields full to confrontation.  Hearing is intact  to conversational speech.  Facial sensation is intact to pinprick.  Face,  tongue, and palate move normally symmetrically.  Motor:  Normal walk and  tone.  She has a little bit of give way weakness on the left but it seems to  me over and above that she does have a mild left hemiparesis, especially in  the arm.  Sensory:  She reports diminished pinprick sensation over the left  upper extremity and to a lesser extent lower extremity compared to the  right.  Coordination:  Rapid movements performed adequately, finger to nose  performed adequately, gait is deferred.  Reflex is brisk on the right.  Toes  are upgoing bilaterally.   LABORATORY REVIEW:  CBC and BMET are pending at this time.  CT of the head  is personally reviewed and the study is remarkable for an old infarct in the  left thalamus without a definite acute finding.   IMPRESSION:  1. Left-sided sensory changes and mild hemiparesis, suspect that she has     had a new right brain  small-vessel stroke.  2. Hypertension, poor control.  3. Recent chest pain, angina versus gastroesophageal reflux disease.   PLAN:  Will admit for a stroke workup, including MRI and MRA.  We will also  repeat her echocardiogram given her cardiac symptoms and check her Doppler's  again.  We will continue on the aspirin, stat, and blood pressure control  for now.  I have also asked [facility] to evaluate her for her  recent symptoms of chest pain and will place her empirically on a proton  pump inhibitor.  Stroke Service to follow.      Michael L. Thad Ranger, M.D.  Electronically Signed     MLR/MEDQ  D:  04/16/2006  T:  04/17/2006  Job:  [id number]   cc:   Dineen Kid. Reche Dixon, M.D.

## 2010-10-13 NOTE — Consult Note (Signed)
NAMEKIRSTAN, [name]               ACCOUNT NO.:  [id number]   MEDICAL RECORD NO.:  [id number]          PATIENT TYPE:  INP   LOCATION:  6713                         FACILITY:  [facility]   PHYSICIAN:  Marlan Palau, M.D.  DATE OF BIRTH:  04-28-1961   DATE OF CONSULTATION:  01/15/2006  DATE OF DISCHARGE:                                   CONSULTATION   HISTORY OF PRESENT ILLNESS:  The patient is a 50 year old right-handed black  female born 07/12/1960 with a history of hypertension that has not  been treated.  The patient recently moved to this area and went off her  medications.  She has not sought a primary doctor.  The patient comes in at  this point for evaluation of some numbness that began involving her right  hand in the evening of January 13, 2006.  This seemed to worsen gradually  over the course of the evening.  The patient noted that her arm was clumsy  when she tried to use the mouse on the computer.  Patient ended up going to  sleep and woke up with more involved deficit involving the right face, arm,  and legs, difficulty with ambulation and multiple falls.  The patient  decided to come into the emergency room for further evaluation.  Workup has  revealed evidence of a left thalamic cerebrovascular infarction and MRI  angiogram of intracranial vessels shows evidence of moderate stenosis of the  right cavernous carotid.  No other evidence of prior small vessel disease  noted.  Neurology was asked to see the patient for further evaluation.   PAST MEDICAL HISTORY:  Significant for  1. History of hypertension.  2. Medical noncompliance.  3. Left thalamic stroke.  4. Bilateral tubal ligation.  5. History of thyroid disease.   The patient is on no medications prior to admission.   ALLERGIES:  PENICILLIN.   SOCIAL HISTORY:  Smokes a half pack of cigarettes a day.  Does not drink  alcohol.  Urine drug screen on this admission was negative. The patient is  single.   Lives in the [place] area.  Has two sons who are  alive and well.   FAMILY MEDICAL HISTORY:  Noted that mother died with congestive heart  failure.  Father died of heart disease.  Patient has four brothers and one  sister.  One sister had diabetes.  History of cancer in a maternal aunt.  Hypertension in a maternal aunt.  Maternal grandfather with diabetes and  colon cancer.  Maternal grandmother with hypertension and stroke.   REVIEW OF SYSTEMS:  Noted for no recent fevers or chills.  Patient does note  headache recently in the vertex of the head.  The patient does note spots in  front of the eyes.  Denies any problems with swallowing.  Had some slurring  of speech with the onset of above deficit.  Denies any problems with chest  pain, but did have some chest pressure two weeks ago and some slight  shortness of breath with this.  The patient denies any nausea or vomiting,  or problems controlling the bowels or bladder.  Denies any blackout  episodes.   PHYSICAL EXAMINATION:  Vitals:  Blood pressure is currently 174/95,  admission blood pressure 209/109, heart rate 69, respiratory rate 25,  temperature afebrile.  In general, this patient is a fairly well-developed  black female who is alert and cooperative at the time of examination.  HEENT  examination:  Head is atraumatic.  Eyes:  Pupils are equal, round, reactive  to light.  Discs are flat bilaterally.  Neck:  Supple.  No carotid bruits  noted.  Respiratory examination is clear.  Cardiovascular examination  reveals a regular rate and rhythm.  No obvious murmurs or rubs noted.  Extremities are without significant edema.  Neurologic examination:  Cranial  nerves are as above.  Facial asymmetry is present.  The patient has some  decreased pin prick sensation in the right face compared to the left.  It  does not split at the midline with vibratory sensation.  Extraocular  movements are full.  Visual fields are full.   Speech is well enunciated and  not aphasic.  Motor testing reveals fairly good strength in direct testing  on all fours.  Good symmetric motor tones noted throughout.  Decreased pin  prick sensation is noted on the right arm, right leg as compared to the  left.  Decreased vibratory sensation of the right arm, right leg as compared  to the left.  Patient has mild clumsiness noted of the right upper extremity  with finger-nose-finger, and heel-to-shin.  Otherwise normal on the left  side.  Mild drift is noted in the right arm and right leg.  Deep tendon  reflexes reveal some increase in right knee jerk as compared to the left  with questionable upgoing tone on the right, not present on the left.  Patient was not ambulated.   LABORATORY VALUES:  Notable for sodium 140, potassium 3.8, chloride of 106,  CO2 of 28, glucose of 89, BUN of 11, creatinine 0.9.  Total bili 0.5, alk  phos 64, SGOT of 18, SGPT of 8, total protein of 6.7, albumin 3.7, calcium  9.2.  Urine drug screen is negative.  White count of 7.7, hemoglobin of  12.8, hematocrit of 38, MCV of 86.9, platelets were clumped; count was not  accurate.  Urinalysis reveals specific gravity of 1.016, pH of 5.5, moderate  bilirubin, 15 mg/dL of ketones, large amount of blood, protein 30 mg/dL, 0-2  white cells.  MRI studies as above.   IMPRESSION:  1. History of hypertension.  2. Medical noncompliance.  3. New onset of left thalamic cerebrovascular infarction.   This patient likely has small vessel disease as a cause of her stroke.  Will  complete the full workup, however, at this point.  The patient has mainly a  right hemisensory deficit with some sensory ataxia.   PLAN:  1. MRI angiogram of the intracranial vessels.  2. A 2D echocardiogram.  3. Physical and occupational therapy evaluation.  4. Aspirin therapy for now.  5. Urine drug screen which was done.  Will follow the patient's clinical     course while in house.  NIH stroke  scale was three.  The patient was      not a candidate for TPA given the duration of her symptoms and minimal      deficits.      Marlan Palau, M.D.  Electronically Signed     CKW/MEDQ  D:  01/15/2006  T:  01/15/2006  Job:  [id number]   cc:   [facility]

## 2010-10-13 NOTE — Discharge Summary (Signed)
[name], [name]               ACCOUNT NO.:  [id number]   MEDICAL RECORD NO.:  [id number]          PATIENT TYPE:  INP   LOCATION:  3021                         FACILITY:  [facility]   PHYSICIAN:  Casimiro Needle L. Reynolds, M.D.DATE OF BIRTH:  [birth year]   DATE OF ADMISSION:  04/16/2006  DATE OF DISCHARGE:                                 DISCHARGE SUMMARY   ADMISSION DIAGNOSES:  1. Left-sided sensory changes and mild hemiparesis, suspect new right      brain stroke.  2. Hypertension, full control.  3. Recent chest pain, angina versus gastroesophageal reflux disease.   DISCHARGE DIAGNOSES:  1. Transient left-sided hemisensory changes and hemiparesis.  Transient      ischemic attack versus functional finding.  2. Hypertension, somewhat better controlled.  3. Recent chest pain, angina versus gastroesophageal reflux disease,      without evidence of acute myocardial infarction.  4. Chronic daily headache secondary to blood pressure versus comorbid      migraine component.  5. Bilateral carotid stenosis 40% to 60% on the right and 60% to 80% on      the left.   CONDITION ON DISCHARGE:  Improved.   DISCHARGE DIET:  Low salt, low fat, low cholesterol.   ACTIVITY:  Ad lib.   DISCHARGE MEDICATIONS:  1. Aspirin 325 mg daily.  2. HCTZ 25 mg daily.  3. Clonidine 0.1 mg b.i.d.  4. Protonix 40 mg daily.  This is new.  5. Lisinopril 40 mg daily (new dose).  6. Pravastatin 40 mg daily (new dose).  7. Atenolol 25 mg b.i.d. (new dose).   STUDIES:  1. CT of the head performed in the emergency room on April 16, 2006,      demonstrating old lacunar infarct in the left posterior limb of the      internal capsule without acute finding.  2. MRI of the brain performed April 16, 2006, demonstrating old infarct      in the left posterior limb of the internal capsule, as above, spinal      stenosis at the C3-4 level, and no evidence of acute infarct.  3. MR angiogram of intracranial circulation  demonstrating a small bulge to      the left carotid terminus, a small aneurysm is not excluded, with mild      atherosclerotic changes, but no critical arterial narrowing.  4. A 2-D echocardiogram performed on April 17, 2006, demonstrating mild      possible hypokinesis of the inferior wall, an EF of 55% to 60%.  No      obvious source of cardiac emboli.  5. Carotid ultrasound performed April 17, 2006, demonstrating right 40%      to 60%  ICA stenosis and left 60% to 80% ICA stenosis.  6. MRI of the cervical spine performed on April 17, 2006, demonstrating      left-sided canal stenosis and foraminal stenosis at the C3-4 level with      some cord contact, but no compression or alteration of the cord and      otherwise unremarkable.   LABORATORY DATA:  1.  CBC:  White count 6.8, hemoglobin 12.2, platelets 175,000 with normal      differential.  Routine chemistries:  CMET unremarkable, except for      slightly low protein and albumin at 5.4 and 2.9 respectively.      Hemoglobin A1c normal at 5.8.  Homocysteine normal at 7.3.  Cardiac      enzymes negative x2.  Lipid profile:  Total cholesterol 176.  HDL 37.      LDL cholesterol elevated at 115.  Drug screen positive for opiates and      marijuana.  2. Chest x-ray April 16, 2006:  Prominent heart size.  No air space      disease or edema.  3. EKG of April 17, 2006:  Sinus bradycardia.  No acute abnormalities.  4. Telemetry monitoring demonstrating no events.   HOSPITAL COURSE:  Please see admission H&P of April 16, 2006, for full  admission details.  Briefly, this is a 50 year old woman, who was  hospitalized in August for a left brain stroke with a mild right  hemiparesis, which had basically resolved.  She presented to the emergency  room on April 16, 2006, with increasing headaches and mild sensory  changes on the left side.  She was admitted for a possible stroke event.  She underwent a routine stroke workup with  the results as above.  She was  noted to have left give-away weakness on her examination.  It was not clear  whether this was a true TIA or a functional finding.  In any case, her  weakness improved throughout hospital stay.  Her blood pressure was poorly  controlled upon arrival to the emergency room.  Blood pressure medicines  were adjusted and increased, resulting in better control.  She was put on a  new regimen, as outlined above, and was advised to continue with the  monitoring of her blood pressure very closely in coordination with her  primary care physician.  Because of her chest pain, cardiology was  consulted.  She underwent an echocardiogram, which demonstrated the above  results.  She had no further chest pain and enzymes were negative.  She was  scheduled for an outpatient Myoview.  Because of the possibility of  gastroesophageal reflux disease, she will also be discharged on Protonix.  On April 19, 2006, she was felt stable for discharge and was discharged  home.   FOLLOWUP:  She was advised to follow up with her primary care physician, Dr.  Reche Dixon, next week.  She was advised to call to arrange for an outpatient  Myoview study through [facility].  She will be seen back at [facility] in 6 weeks for followup of her cerebrovascular  disease, including bilateral carotid disease, as well as her headaches.  At  that time, consider possible preventative medications.      Michael L. Thad Ranger, M.D.  Electronically Signed     MLR/MEDQ  D:  04/19/2006  T:  04/19/2006  Job:  [id number]   cc:   Dineen Kid. Reche Dixon, M.D.

## 2010-10-13 NOTE — Discharge Summary (Signed)
[name], [name]               ACCOUNT NO.:  [id number]   MEDICAL RECORD NO.:  [id number]          PATIENT TYPE:  INP   LOCATION:  6713                         FACILITY:  [facility]   PHYSICIAN:  [name], MDDATE OF BIRTH:  [birth year]   DATE OF ADMISSION:  01/14/2006  DATE OF DISCHARGE:  01/17/2006                                 DISCHARGE SUMMARY   DISCHARGE DIAGNOSES:  1. Left thalamic cerebrovascular infarct secondary to small vessel      disease.  2. Uncontrolled hypertension.  3. Tobacco abuse, counseled against.  4. Medical noncompliance.  5. Hyperlipidemia.   DISCHARGE MEDICATIONS:  Aspirin 325 mg p.o. daily, hydrochlorothiazide 25 mg  p.o. daily, metoprolol 50 mg p.o. b.i.d.,  Clonidine 0.1 mg p.o. b.i.d.,  Zocor 20 mg p.o. daily.   CONDITION:  Stable.   ACTIVITY:  She is to use a straight cane.  Home physical therapy and home  occupational therapy has been arranged.  She is to follow up with Dr. Pearlean Brownie  in 2-3 months.  She is to follow up with [facility] first available  appointment.   PROCEDURES:  None.   PERTINENT LABORATORY DATA:  LDL was 115, HDL 42, triglycerides 55.  TSH  1.271.  Homocystine 7.  CBC unremarkable.  Complete metabolic panel  unremarkable.  Urine drug screen negative.  UA showed moderate leukocyte  esterase, 0-2 white cells, large hemoglobin.   SPECIAL STUDIES AND RADIOLOGY:  EKG showed normal sinus rhythm.  Echocardiogram showed normal ejection fraction, mild mitral valvular  regurgitation. Carotid Dopplers showed no significant stenosis. A CT of the  brain without contrast showed nothing acute.  MRI of the brain showed left  inferior thalamus acute infarct 8 x 10 mm.  MRA was negative.   HISTORY AND HOSPITAL COURSE:  [name] is a pleasant unassigned 50 year old  black female with a history of hypertension who has not followed up with a  doctor for quite some time and has not taken her medications for over a  year.  She presented  with weakness, clumsiness, and numbness of her right  arm, also some difficulty with walking. She felt like her knee was going to  give out.  She had severe headache, as well.  He her symptoms were for about  a day.  Please see H&P for complete admission details.   Her blood pressure in the emergency room with 209/109, otherwise,  unremarkable vital signs.  She had right pronator drift, slight facial  asymmetry, and decreased pinprick sensation on the right face compared to  the left, decreased to vibratory and pinprick sensation of the right arm and  leg, right upper extremity clumsiness, some increase in the right knee jerk,  questionable upgoing Babinski's on the right.   She was seen by Dr. Anne Hahn in the emergency room who recommended that the  patient be admitted to the hospitalist.  It was felt that her stroke was due  to small vessel disease.  Her blood pressure medications were started.  She  was started on aspirin. She is a smoker and she received tobacco cessation  counseling  and her LDL also was slightly elevated and she was started on  Zocor. She will need to repeat liver function tests in three months.  She  has no primary care physician.  We have arranged follow-up at [facility]  and given three days' worth of medications. Her strength and problems with  coordination and gait improved somewhat during her hospitalization but she  still had some clumsiness on the right arm. She was seen by physical therapy  and occupational therapy.      Karina L. Lendell Caprice, MD  Electronically Signed     CLS/MEDQ  D:  01/17/2006  T:  01/18/2006  Job:  [id number]   cc:   Pramod P. Pearlean Brownie, MD

## 2010-10-13 NOTE — Consult Note (Signed)
[name], [name]               ACCOUNT NO.:  [id number]   MEDICAL RECORD NO.:  [id number]          PATIENT TYPE:  INP   LOCATION:  3021                         FACILITY:  [facility]   PHYSICIAN:  [name]. Eden Emms, MD, FACCDATE OF BIRTH:  08/25/1960   DATE OF CONSULTATION:  04/16/2006  DATE OF DISCHARGE:                                   CONSULTATION   CARDIOLOGY CONSULTATION   PRIMARY MD:  Dr. Linton Rump at [facility].   CHIEF COMPLAINT:  Chest pain radiating to both arms on day prior to  admission.   HISTORY OF PRESENT ILLNESS:  This is a 50 year old pleasant, African  American female, who was admitted with CVA and uncontrolled hypertension by  the stroke team.  The patient awoke this morning with severe headache on the  right side, radiating into the top of her head.  The patient presented to  the emergency room as a result and was found to be hypertensive with a blood  pressure of 195/110.  The patient was given clonidine 0.1 mg p.o. x1,  Dilaudid IV and Zofran IV.  Blood pressure has decreased down to 163/96.  Heart rate, originally, was 109 and is now down to 80 beats per minute.   The patient also had complaints of chest pain since 2022/09/19 after her mother's  death, which she describes as pressure in the chest.  She was seen at the ER  in [place], [place] last week and had a followup stress test, which  patient states was normal.  Last week, she had chest pressure right after  physical therapy with associated clamminess radiating to both arms, which  away on its own, lasting approximately 15 minutes with associated shortness  of breath.  The patient also had trouble taking deep breaths as well.  She  felt exhausted afterwards and slept for about 4 hours and felt better.  The  following evening, the patient had another occurrence of chest pain and  pressure, which she describes as 10/10, radiating bilaterally to the arms  with associated trouble taking deep breaths.  This  a.m. the patient, as  described above, had a severe headache.  She denies any medical  noncompliance at present, although she does have a history of such.  The  patient has been taking her medications daily she states without skipping  doses.   PAST MEDICAL HISTORY:  Includes:  1. 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  2. Uncontrolled hypertension.  3. Tobacco abuse.  4. Hyperlipidemia.  5. Medical noncompliance.  6. History of thyroid disease.   PAST SURGICAL HISTORY:  Bilateral tubal ligation.   PAST CARDIAC WORKUP:  The patient did have a Cardiolite stress test in 2005/09/18 in [place], [place], which patient states was normal.  Patient also had an echocardiogram during prior admission to this hospital  in August of 2007 revealing LV size upper limits of normal, overall left  ventricular systolic function was normal with an EF of 60%.  There was no  left ventricular regional wall motion abnormality.  Left ventricular wall  thickness was mildly  increased.  There was mild mitral valvular regurg.  The  effective orifice of mitral regurgitation by proximal isovelocity surface  area was 0.07 cm/2.  The volume of mitral regurg by proximal velocity  surface was 15 cc and this was read by Dr. Arvilla Meres.  Patient also  had bilateral carotid ultrasounds, revealing no bilateral ICA stenosis,  vertebral flow antegrade and this was read by Dr. Rito Ehrlich.   SOCIAL HISTORY:  The patient lives in [place] with her boyfriend.  She is  unemployed.  She has 2 sons.  The patient is a 20 pack year tobacco abuse.  Negative for ETOH or illicit drug use.  She does go to physical therapy  secondary to CVA with left-sided hemiparesis.  No herbal medicine use.   FAMILY HISTORY:  Mother died of CHF in 10-04-2005.  Father died of CAD.  She has 1 sister with diabetes and 5 other brothers in good health.   CURRENT MEDICATIONS:  At home:  1. Aspirin 325 mg 1 p.o.  daily.  2. Hydrochlorothiazide 25 mg p.o. daily.  3. Metoprolol 50 mg p.o. b.i.d.  4. Clonidine 0.1 mg p.o. b.i.d.  5. Zocor 20 mg p.o. daily.   ALLERGIES:  TO PENICILLIN.   CURRENT LABS:  Sodium 139, potassium 3.7, chloride 109, BUN 9, creatinine  1.1, glucose 90, hemoglobin 13.6, hematocrit 40.0.  Troponin is pending.  EKG revealing normal sinus rhythm without evidence of ischemia or LVH.   PHYSICAL EXAMINATION:  GENERAL:  She is awake, alert and oriented without  acute distress.  HEENT:  Head is normocephalic and atraumatic.  Eyes:  PERRLA.  Mucous  membranes and mouth pink and moist.  Tongue is midline.  NECK:  Supple without JVD or carotid bruits appreciated.  CARDIOVASCULAR:  Regular rate and rhythm without murmurs, rubs or gallops.  LUNGS:  Essentially clear to auscultation.  ABDOMEN:  There is some mild tenderness in the lower quadrants.  The patient  states that she has fibroid tumors, which cause her to have abdominal pain  during menses.  She does have 2+ bowel sounds.  EXTREMITIES:  One plus dorsalis pedis pulses and radial pulses.  Without  clubbing, cyanosis or edema.  SKIN:  Warm and dry.  NEURO:  The patient does have some slowed speech with some mild weakness on  the left side.  She is able to move all extremities, but there is mild  weakness in hand grip on the left and on the left leg as well.   CT scan revealing a low density in the left thalamus, which may be subacute  or chronic without hemorrhage.   IMPRESSION:  1. Chest pain, rule out ACS.  We will do serial cardiac enzymes and repeat      echocardiogram to evaluate left ventricular function and aortic size.      Plan inpatient Myoview study after discharge.  2. Uncontrolled hypertension.  We will increase lisinopril to 40 mg 1 p.o.      daily and continue clonidine as directed.  Would consider renal artery      ultrasound to rule out renal artery stenosis. 3. Status post thalamic cerebrovascular  accident, followed by stroke team.      Would recommend not using aspirin.  4. History of tobacco abuse.  5. History of hypercholesterolemia.  We will check labs and continue Zocor      as she is on it at home.   We will review labs in the a.m., monitor  EKGs and cardiac enzymes as  described above.  Recommend strongly smoking cessation and medical  compliance.  We will follow, making further recommendations throughout  hospital course.      Bettey Mare. Lyman Bishop, NP      [name]. Eden Emms, MD, [facility]  Electronically Signed    KML/MEDQ  D:  04/16/2006  T:  04/17/2006  Job:  [id number]

## 2010-10-13 NOTE — Assessment & Plan Note (Signed)
[facility]                            CARDIOLOGY OFFICE NOTE   WYLEE, OGDEN                        MRN:          [id number]  DATE:05/07/2006                            DOB:          [birth year]    Ms. Placke returns today for followup.  She has had chest pain with a  thalamic stroke and hypertension.  Her Myoview suggested LV cavity  dilatation.  She had a heart cath without significant coronary artery  disease.  Her post cath check today showed that her groin is well-  healed.  She continues to occasionally get some sensory problems on the  left side of her body.  I am not sure what to make of these.   Her blood pressure is much better controlled.  She tends to have a lower  blood pressure during her menses and she bleeds a lot.  Her medications  currently include an aspirin a day, hydrochlorothiazide 25 a day,  Lopressor 50 b.i.d., clonidine 0.1 b.i.d., and Zocor.   REVIEW OF SYSTEMS:  Remarkable for a question of fibroids.  She is to  have an ultrasound of her bladder today.   EXAM:  She looks well.  Blood pressure is quite low at 120/70, pulse 70 and regular.  HEENT:  Normal.  LUNGS:  Clear.  There is an S1 and S2 with normal heart sounds.  ABDOMEN:  Benign.  Right femoral artery and vein are well-healed.  There  is no bruit.  Distal pulses are intact with no edema.  NEURO:  Nonfocal.   IMPRESSION:  1. Stable hypertension, well-controlled.  Currently on 3 medications.  2. Hypercholesterolemia.  Continue Zocor.  Check LFTs in 6 months.  3. Chest pain with abnormal Myoview but normal coronary arteriography.   No evidence of renal artery stenosis.   The patient will see me back in 6 months to followup her blood pressure.  She will follow up with Dr. Reche Dixon in [facility] for her general  medical needs.     [name]. Eden Emms, MD, [facility]  Electronically Signed    PCN/MedQ  DD: 05/07/2006  DT: 05/07/2006  Job #: [id number]

## 2010-10-13 NOTE — H&P (Signed)
[name], [name]               ACCOUNT NO.:  [id number]   MEDICAL RECORD NO.:  [id number]          PATIENT TYPE:  EMS   LOCATION:  MAJO                         FACILITY:  [facility]   PHYSICIAN:  Hollice Espy, M.D.DATE OF BIRTH:  January 30, 1961   DATE OF ADMISSION:  01/14/2006  DATE OF DISCHARGE:                                HISTORY & PHYSICAL   The patient has no PCP.   CHIEF COMPLAINT:  Headache and right-sided numbness.   HISTORY OF PRESENT ILLNESS:  The patient is a 50 year old African-American  female, with past medical history of hypertension, medical noncompliance and  tobacco abuse, who presents to the emergency room after a one-day history of  headache and right-sided numbness and found to have an acute CVA.  The  patient previously had been diagnosed with high blood pressure for the past  13 years.  She tells me that she recently had been put on Diovan a couple of  years ago but has not been on it for now a couple of months.  She has seen  no physician since she moved to [facility] in the past year secondary to no  insurance.  The patient starting yesterday started complaining of some  numbness on the right side including her upper and lower extremities and  then today started complaining of some occasional blurry vision episodes but  a severe headache.  She could not take it anymore and came into the  emergency room.  In the emergency room the patient was noted to have an  initial elevated blood pressure of 209/109 with a heart rate of 89.  The  patient had a CT scan checked which was unremarkable, however, with  continued symptoms an MRI was ordered, and she was found to have an acute  CVA on the right thalamus.  In addition, the MRI/MRA noted some severe  stenosis on the left-hand side.  With these findings Neurology was consulted  but deferred to the hospital for admission.  In the interim the patient's  blood pressure has since come down slightly to 154/95.  She  continues to  complain of numbness but says her headache is getting better.  She denies  any vision changes now.  No dysphagia.  No chest pain, palpitations,  shortness of breath, wheezing, coughing.  No abdominal pain.  No hematuria,  dysuria, constipation, diarrhea, focal extremity pain.   REVIEW OF SYSTEMS:  Otherwise negative.   PAST MEDICAL HISTORY:  Hypertension, medical noncompliance, tobacco abuse.   MEDICATIONS:  She was on Diovan but has not taken this in months.   ALLERGIES:  PENICILLIN.   SOCIAL HISTORY:  She smokes about a pack a day.  Denies any drug or alcohol  use.   FAMILY HISTORY:  Noted for CAD.   PHYSICAL EXAMINATION:  VITALS ON ADMISSION:  Temp 97.8, heart rate 89 now  down to 70, blood pressure 209/109, now down to 154/95, respirations 20.  O2  sat 98% on room air.  GENERAL:  The patient is alert and oriented x3 in no apparent distress.  HEENT:  Normocephalic, atraumatic.  Her mucous  membranes are moist.  She has  right-sided carotid bruit and what appears to be a palpable thyroid gland.  Cranial nerves II-XII are intact.  HEART:  Regular rate and rhythm.  S1, S2.  LUNGS:  Clear to auscultation bilaterally with some mild expiratory wheezing  on the right-hand side.  ABDOMEN:  Soft, nontender and nondistended with positive bowel sounds.  EXTREMITIES:  No clubbing, cyanosis or edema.  She does have some mild  weakness, about a 5-/5 in the upper and lower extremities in terms of grip,  flexion and extension.  Negative Babinski's sign on both sides.  No  dysdiadokinesis .  No evidence of any cerebellar dysfunction.  Left side is  normal in terms of flexion, extension and grip.   LABORATORY:  MRI and CT are as above.  White count 7.7, H&H 12.8 and 38, MCV  87, platelet count noted to be in clumps.  Sodium 140, potassium 3.8,  chloride 106, bicarb 20, BUN 11, creatinine 0.9, glucose 89.  LFTs are  normal.  Coags are normal.  Urine drug screen I have ordered and  is pending.   ASSESSMENT/PLAN:  1. Acute cerebrovascular accident, likely secondary to noncompliance and      severe hypertension, but we will go ahead and check bilateral carotid      Dopplers and 2D echo as well as fasting lipid profile, homocystine      level and a urine drug screen.  We will start on atenolol 25 p.o.      q.12h. not wanting to drop her blood pressure too much and hypoperfuse      her.  Neurology, Dr. Thad Ranger, has already been talked to, and they      will follow up tomorrow.  2. Thyroid fullness.  We will check a TSH level and if need be check a      thyroid ultrasound to rule out secondary causes.  3. Medical noncompliance.  4. Tobacco abuse.  The patient declines a nicotine patch at this time, but      we have counseled her on the need for tobacco cessation which she tells      me she will do.  5. Hypertension.  As mentioned above, we will start her on atenolol.      Likely, we will need to put her on multiple medications long term.      Hollice Espy, M.D.  Electronically Signed     SKK/MEDQ  D:  01/15/2006  T:  01/15/2006  Job:  [id number]

## 2010-10-13 NOTE — Cardiovascular Report (Signed)
NAMEAMORE, ACKMAN               ACCOUNT NO.:  [id number]   MEDICAL RECORD NO.:  [id number]          PATIENT TYPE:  OIB   LOCATION:  1961                         FACILITY:  [facility]   PHYSICIAN:  Peter C. Eden Emms, MD, FACCDATE OF BIRTH:  [birth year]   DATE OF PROCEDURE:  DATE OF DISCHARGE:  04/26/2006                            CARDIAC CATHETERIZATION   [name] is a 50 year old patient, who I saw in the emergency room a  few weeks ago.  At the time, she had a hypertensive crisis with a  sensory TIA.  The patient subsequently had an adenosine Myoview.  She  had prolonged junctional rhythm with LV cavity dilatation during the  stress portion.  A catheterization was done to assess the patient's  clinical syndrome of hypertension, chest pain and abnormal Myoview.   Cine catheterization was done with 4-French catheters from right femoral  artery.   Left main coronary was normal.   Left anterior descending artery was a large artery.  There were two  large diagonal branches.  The LAD wrapped the apex.  It was normal.   The patient was left dominant with a large circumflex branch which was  normal.   The right coronary artery was small and nondominant and was normal.   RAO VENTRICULOGRAPHY:  Showed hyperdynamic LV function.  EF was 65%.  There was no gradient across the aortic valve and no MR.  LV pressure is  110/5.  Aortic pressure is 110/61.  Note should be made, that the  patient was sedated with 3 mg of Versed during the case.   We elected to do an LAO aortography due to the patient's lack of  coronary disease and significant chest pain requiring trips to the ER  and office.  LAO aortography showed no evidence of aortic dissection.  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   Selective injection of the right and left renal arteries due to the  patient's significant hypertension requiring four drugs and sensory TIA  with thalamic infarct showed that the renal arteries were widely patent  with no evidence renal artery stenosis.   IMPRESSION:  [name] cardiac status appears stable.  She has no  significant coronary artery disease.  I am not sure why her Myoview  showed evidence of left ventricle cavity dilatation.   1. The patient will continue her multiple medications for blood      pressure control.  2. She will follow up with neurology in regards to her sensory TIA.  3. The patient tolerated the procedure well.      Karina Pick. Eden Emms, MD, [facility]  Electronically Signed     PCN/MEDQ  D:  04/26/2006  T:  04/26/2006  Job:  [id number]

## 2010-10-13 NOTE — Assessment & Plan Note (Signed)
[facility]                            CARDIOLOGY OFFICE NOTE   [name], [name]                        MRN:          [id number]  DATE:07/12/2006                            DOB:          [birth year]    [name] returns today in followup.  She continues with stabbing atypical  chest pain.  I really think that this is all due to stress.  The patient  was actually crying a little bit today.  She apparently stayed at a  motel last night.  Her niece was with her.  They did not volunteer any  information about what are the different stresses in her life, but  clearly this the problem.  She had a heart catheterization at the end of  November, which was totally normal.  Her aortic root was normal, and her  renal arteries are normal.  She has been taking her blood pressure  medicines, and her blood pressure is better controlled.   REVIEW OF SYSTEMS:  Remarkable for significant headache, PND or  orthopnea.  She has continued atypical chest pain.  She has not had any  significant shortness of breath, lower extremity edema or history of PE.  I told her she should talk to Dr. Linton Rump; it helps her regarding  possibility of changing her antidepressants or starting her on some  Xanax.  She is on trazodone 50 mg a day.   Her other medications include:  1. Aspirin a day.  2. Hydrochlorothiazide 25  a day.  3. Clonidine 0.1 b.i.d.  4. Protonix 40 a day.  5. Lisinopril 40 a day.  6. Pravastatin 40 a day.  7. Atenolol 25 b.i.d.  8. Topamax 50 a day.   EXAMINATION:  Blood pressure is 140/80.  Pulse 84 and regular.  HEENT:  Normal.  There is no carotid bruits.  No lymphadenopathy.  No JVP elevation.  LUNGS:  Clear.  There is an S1 and S2 with normal heart sounds.  ABDOMEN:  Benign.  LOWER EXTREMITIES:  Intact pulses.  No edema.   Her EKG is essentially normal with a heart rate of 84.   IMPRESSION:  Atypical chest pain with clean catheterization, normal EKG,  increased stress levels.  Consider changing trazodone to a different  drug or selective serotonin reuptake inhibitor.  Follow up with Dr. Linton Rump for this.  I will see her back in a year.  Her blood pressure seems reasonably controlled.  She will continue to  watch salt in her diet and try to avoid alcohol.     [name]. Eden Emms, MD, [facility]  Electronically Signed    PCN/MedQ  DD: 07/12/2006  DT: 07/12/2006  Job #: [id number]

## 2010-11-16 ENCOUNTER — Ambulatory Visit: Payer: Medicare Other | Admitting: Obstetrics and Gynecology

## 2010-11-16 ENCOUNTER — Other Ambulatory Visit: Payer: Self-pay | Admitting: Obstetrics and Gynecology

## 2010-11-16 DIAGNOSIS — Z124 Encounter for screening for malignant neoplasm of cervix: Secondary | ICD-10-CM

## 2010-11-16 DIAGNOSIS — IMO0002 Reserved for concepts with insufficient information to code with codable children: Secondary | ICD-10-CM

## 2010-11-16 DIAGNOSIS — Z01419 Encounter for gynecological examination (general) (routine) without abnormal findings: Secondary | ICD-10-CM

## 2010-11-17 NOTE — Group Therapy Note (Signed)
[name], [name]               ACCOUNT NO.:  [id number]  MEDICAL RECORD NO.:  [id number]           PATIENT TYPE:  A  LOCATION:  [facility]                   FACILITY:  [facility]  PHYSICIAN:  Argentina Donovan, MD        DATE OF BIRTH:  Oct 24, 1960  DATE OF SERVICE:  11/16/2010                                 CLINIC NOTE  The patient is a 50 year old African American female who underwent laparoscopic total abdominal hysterectomy and bilateral salpingo- oophorectomy for endometrioid cancer about 1 year ago.  She had been followed up in the clinic over there as well as hear.  Her biggest problem is that postcoital spotting and pain with intercourse. Apparently someone gave her Premarin last time and that did not work.  On examination the apex of the vagina is slightly erythematous but there is one tiny area of bleeding that is small fissure which had been previously treated with silver nitrate by Dr. Nelly Rout about December of last year.  What I am going to do is have this lady which I think has some portion of vaginismus takes superior position in intercourse, use Replens every day and use a KY lubricant for sex both on her and her partner, and so these are 3 things she is going to do for couple weeks and I am going to have her come back in a couple weeks and check that spot where she has a fissure and see if we can get that heel up a little better.  IMPRESSION:  Postoperative dyspareunia secondary to atrophic vaginitis and vaginismus with vaginal dryness.          ______________________________ Argentina Donovan, MD    PR/MEDQ  D:  11/16/2010  T:  11/17/2010  Job:  [id number]

## 2010-11-25 DIAGNOSIS — C541 Malignant neoplasm of endometrium: Secondary | ICD-10-CM | POA: Insufficient documentation

## 2010-12-13 ENCOUNTER — Ambulatory Visit: Payer: Medicare Other | Admitting: Obstetrics and Gynecology

## 2011-01-11 ENCOUNTER — Ambulatory Visit: Payer: Medicare Other | Attending: Gynecologic Oncology | Admitting: Gynecologic Oncology

## 2011-01-11 DIAGNOSIS — Z8673 Personal history of transient ischemic attack (TIA), and cerebral infarction without residual deficits: Secondary | ICD-10-CM | POA: Insufficient documentation

## 2011-01-11 DIAGNOSIS — K219 Gastro-esophageal reflux disease without esophagitis: Secondary | ICD-10-CM | POA: Insufficient documentation

## 2011-01-11 DIAGNOSIS — R635 Abnormal weight gain: Secondary | ICD-10-CM | POA: Insufficient documentation

## 2011-01-11 DIAGNOSIS — F17200 Nicotine dependence, unspecified, uncomplicated: Secondary | ICD-10-CM | POA: Insufficient documentation

## 2011-01-11 DIAGNOSIS — E785 Hyperlipidemia, unspecified: Secondary | ICD-10-CM | POA: Insufficient documentation

## 2011-01-11 DIAGNOSIS — Z90710 Acquired absence of both cervix and uterus: Secondary | ICD-10-CM | POA: Insufficient documentation

## 2011-01-11 DIAGNOSIS — I10 Essential (primary) hypertension: Secondary | ICD-10-CM | POA: Insufficient documentation

## 2011-01-11 DIAGNOSIS — Z9079 Acquired absence of other genital organ(s): Secondary | ICD-10-CM | POA: Insufficient documentation

## 2011-01-11 DIAGNOSIS — J449 Chronic obstructive pulmonary disease, unspecified: Secondary | ICD-10-CM | POA: Insufficient documentation

## 2011-01-11 DIAGNOSIS — C549 Malignant neoplasm of corpus uteri, unspecified: Secondary | ICD-10-CM | POA: Insufficient documentation

## 2011-01-11 DIAGNOSIS — J4489 Other specified chronic obstructive pulmonary disease: Secondary | ICD-10-CM | POA: Insufficient documentation

## 2011-01-11 DIAGNOSIS — IMO0002 Reserved for concepts with insufficient information to code with codable children: Secondary | ICD-10-CM | POA: Insufficient documentation

## 2011-01-16 NOTE — Consult Note (Signed)
[name], [name]               ACCOUNT NO.:  [id number]  MEDICAL RECORD NO.:  [id number]  LOCATION:  GYN                          FACILITY:  [facility]  PHYSICIAN:  Laurette Schimke, MD     DATE OF BIRTH:  [birth year]  DATE OF CONSULTATION: DATE OF DISCHARGE:                                CONSULTATION   REASON FOR A VISIT:  Surveillance of stage IA grade 1 endometrial cancer.  HISTORY OF PRESENT ILLNESS:  This is a 50 year old who reported vaginal bleeding in July 2010 which progressed and she sort evaluation in January 2011.  An endometrial biopsy was consistent a grade 1 endometrial adenocarcinoma.  She was prescribed Provera, but continued to have heavy bleeding.  The patient has multiple comorbidities and required preoperative clearance.  On August 16, 2009, she had a total laparoscopic hysterectomy and bilateral salpingo-oophorectomy.  Final pathology was notable for FIGO grade 2 endometrioid endometrial carcinoma without any evidence of myometrial involvement.  No stromal involvement.  No lymphovascular space invasion.  She has been followed without any adjuvant therapy.  PAST MEDICAL HISTORY: 1. Stage IA grade 2 endometrial cancer. 2. Hypertension. 3. Dyslipidemia. 4. Chronic obstructive pulmonary disease. 5. Migraine headaches. 6. Depression. 7. Gastroesophageal reflux disease. 8. 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  PAST SURGICAL HISTORY: 1. Laparoscopic hysterectomy. 2. Bilateral salpingo-oophorectomy in March 2011. 3. Right breast cyst aspiration in 1990s. 4. Bilateral tubal ligation in 1980s. 5. Cardiac catheterization  in 2008.  FAMILY HISTORY:  Unchanged.  SOCIAL HISTORY:  [name] reports dyspareunia with deep penetration. She has resumed smoking, particularly because of her weight gain.  REVIEW OF SYSTEMS:  No nausea, vomiting, fever, chills, or abdominal pain.  No hematochezia, hematocolpos, or hematuria.  Otherwise  10-point review of systems is negative.  Of note, is a 26 pounds weight gain since diagnosis.  PHYSICAL EXAMINATION:  GENERAL:  Well-developed female distressed about her weight. VITAL SIGNS:  Temperature 98.3, pulse 98, blood pressure 164/80, and weight 144 pounds. CHEST:  Clear to auscultation. HEART:  Regular rate and rhythm. ABDOMEN:  Soft and nontender. BACK:  No CVA tenderness. PELVIC:  Normal external genitalia, Bartholin's, urethral and Skene's. No lesions noted within the vagina, examination nontender.  Rectal examination good anal sphincter without any masses.  IMPRESSION: 1. [name] has a stage IA grade 2 endometrial carcinoma without any     evidence of myometrial involvement or the high risk features.  The     plan is for her to follow-up with Dr. Okey Dupre in 3 months and GYN     Oncology in 6 months.  After this, GYN Oncology will likely see her     only annually and she will also be seen annually by Dr. Okey Dupre. 2. Weight gain:  [name] reports a very restricted diet almost near     starvation with marked increase in weight.  She states that she has     a diabetic like diet and is very distressed by the weight gain,     particularly concerned because in the presence of other significant     comorbid factor such as hypertension, chronic obstructive pulmonary  disease and history of lacunar infarct within the brain, she is     using tobacco to decrease her weight.  I have referred her     nutrition counseling to better evaluate.  I presumed that other     considerations could be made for evaluation for hypothyroidism.     Laurette Schimke, MD     WB/MEDQ  D:  01/11/2011  T:  01/12/2011  Job:  [id number]  cc:   Javier Glazier. Okey Dupre, M.D.  Dineen Kid Reche Dixon, M.D. Fax: [phone]  Telford Nab, R.N. [address] [place], [place] [address]  Electronically Signed by Laurette Schimke MD on 01/16/2011 07:10:15 AM

## 2011-03-07 ENCOUNTER — Ambulatory Visit (INDEPENDENT_AMBULATORY_CARE_PROVIDER_SITE_OTHER): Payer: Medicare Other | Admitting: Obstetrics and Gynecology

## 2011-03-07 ENCOUNTER — Encounter: Payer: Self-pay | Admitting: Obstetrics and Gynecology

## 2011-03-07 VITALS — BP 112/77 | HR 87 | Temp 98.5°F | Ht 63.0 in | Wt 136.9 lb

## 2011-03-07 DIAGNOSIS — Z23 Encounter for immunization: Secondary | ICD-10-CM

## 2011-03-07 DIAGNOSIS — N952 Postmenopausal atrophic vaginitis: Secondary | ICD-10-CM

## 2011-03-07 MED ORDER — INFLUENZA VIRUS VACC SPLIT PF IM SUSP
0.5000 mL | Freq: Once | INTRAMUSCULAR | Status: AC
  Administered 2011-03-07: 0.5 mL via INTRAMUSCULAR

## 2011-03-07 NOTE — Progress Notes (Signed)
Pt refer to [facility] Nov 15 8:00 am.

## 2011-03-07 NOTE — Progress Notes (Signed)
This patient is a 50 year old African American female who underwent total bowel hysterectomy and bilateral salpingo-oophorectomy one year ago for endometrial cancer. She's been followed by oncology is well as our office postoperatively. Her last visit in June we did Pap smear which was normal with exception of a Trichomonas infection which was treated. She did have a fissure at the apex of the vagina which was very friable and which was treated with silver nitrate with success and she has had no bleeding since that time. Her main problem had been dyspareunia and she a try estrogen before someone to put her on that which did not work we started her on Replens daily which seemed to have helped somewhat with the lubrication but it did not help the pain today is obvious the pain is palpable abdominally and vaginally and the anterior wall of the vagina and I think that this patient because of her urinary urgency R. her frequency and or nocturia has interstitial cystitis. I'm going to refer her to a urologist for confirmation and treatment.  Depression: Interstitial cystitis. Atrophic vaginitis.  Plan: Continue Replens. Get flu shot today. Refer to urologist for confirmation and treatment for interstitial cystitis.

## 2011-03-07 NOTE — Progress Notes (Signed)
Addended by: Lynnell Dike on: 03/07/2011 02:52 PM   Modules accepted: Orders

## 2011-07-11 ENCOUNTER — Encounter: Payer: Self-pay | Admitting: Gynecologic Oncology

## 2011-07-12 ENCOUNTER — Encounter: Payer: Self-pay | Admitting: Gynecologic Oncology

## 2011-07-12 ENCOUNTER — Ambulatory Visit: Payer: Medicare Other | Attending: Gynecologic Oncology | Admitting: Gynecologic Oncology

## 2011-07-12 VITALS — BP 142/80 | HR 78 | Temp 98.6°F | Resp 16 | Ht 63.6 in | Wt 127.6 lb

## 2011-07-12 DIAGNOSIS — Z9079 Acquired absence of other genital organ(s): Secondary | ICD-10-CM | POA: Insufficient documentation

## 2011-07-12 DIAGNOSIS — N898 Other specified noninflammatory disorders of vagina: Secondary | ICD-10-CM | POA: Insufficient documentation

## 2011-07-12 DIAGNOSIS — Z79899 Other long term (current) drug therapy: Secondary | ICD-10-CM | POA: Insufficient documentation

## 2011-07-12 DIAGNOSIS — I10 Essential (primary) hypertension: Secondary | ICD-10-CM | POA: Insufficient documentation

## 2011-07-12 DIAGNOSIS — C55 Malignant neoplasm of uterus, part unspecified: Secondary | ICD-10-CM | POA: Insufficient documentation

## 2011-07-12 DIAGNOSIS — B373 Candidiasis of vulva and vagina: Secondary | ICD-10-CM

## 2011-07-12 DIAGNOSIS — Z7982 Long term (current) use of aspirin: Secondary | ICD-10-CM | POA: Insufficient documentation

## 2011-07-12 DIAGNOSIS — Z90710 Acquired absence of both cervix and uterus: Secondary | ICD-10-CM | POA: Insufficient documentation

## 2011-07-12 MED ORDER — DIFLUCAN 100 MG PO TABS: 100.0000 mg | ORAL_TABLET | Freq: Every day | ORAL | Status: AC

## 2011-07-12 NOTE — Patient Instructions (Signed)
Followup with the GYN oncology service in 6 months.  An electronic prescription for Diflucan 100 mg was prescribed for management of vaginal candidiasis.

## 2011-07-12 NOTE — Progress Notes (Signed)
Follow-up Note: Gyn-Onc   [name] 51 y.o. female  CC:  Chief Complaint  Patient presents with  . Uterine cancer surveillance  . Vaginal Discharge    ZOX:WRUE is a 51 year old female, diagnosed  with a grade I endometrial cancer on August 16, 2009, she underwent  laparoscopic hysterectomy and bilateral salpingo-oophorectomy. 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 [name] was seen in the resident's clinic approximately  3 months ago, at which time she states that a discussion was held with  her and she was given medications for hot flashes. She denies any  nausea, vomiting, abdominal pain, or vaginal discharge.   Interval History: Patient/discharge of one week's duration. The same time he enjoyed which she received antibiotics for an upper respiratory tract infection  Performance status: 0  Current Meds:  Outpatient Encounter Prescriptions as of 07/12/2011  Medication Sig Dispense Refill  . aspirin 325 MG tablet Take 325 mg by mouth daily.        . beclomethasone (QVAR) 80 MCG/ACT inhaler Inhale 1 puff into the lungs as needed.        . clonazePAM (KLONOPIN) 1 MG tablet Take 1 mg by mouth 2 (two) times daily.       . cloNIDine (CATAPRES) 0.2 MG tablet Take 0.1 mg by mouth 2 (two) times daily.        . hydrochlorothiazide 25 MG tablet Take 50 mg by mouth daily.        . hydrOXYzine (ATARAX/VISTARIL) 50 MG tablet Take 50 mg by mouth at bedtime. 2-3 tab at bedtime       . mirtazapine (REMERON) 30 MG tablet Take 45 mg by mouth at bedtime.        . RABEprazole (ACIPHEX) 20 MG tablet Take 20 mg by mouth daily.      Marland Kitchen SIMVASTATIN PO Take 1 tablet by mouth daily.      Marland Kitchen topiramate (TOPAMAX) 50 MG tablet Take 50 mg by mouth 2 (two) times daily.        . traZODone (DESYREL) 50 MG tablet Take 50 mg by mouth as needed. Take 1 or 2 at bedtime as needed for sleep      . ARIPiprazole (ABILIFY) 10 MG tablet Take 10 mg by mouth daily.        Marland Kitchen  DIFLUCAN 100 MG tablet Take 1 tablet (100 mg total) by mouth daily.  1 tablet  1  . pravastatin (PRAVACHOL) 40 MG tablet Take 40 mg by mouth daily.          Allergy:  Allergies  Allergen Reactions  . Penicillins     "breakout and itch"    Social Hx:   History   Social History  . Marital Status: Single    Spouse Name: N/A    Number of Children: N/A  . Years of Education: N/A   Occupational History  . Not on file.   Social History Main Topics  . Smoking status: Current Everyday Smoker -- 0.2 packs/day    Types: Cigarettes  . Smokeless tobacco: Never Used  . Alcohol Use: No  . Drug Use: No  . Sexually Active: Yes    Birth Control/ Protection: Surgical   Other Topics Concern  . Not on file   Social History Narrative  . No narrative on file    Past Surgical Hx:  Past Surgical History  Procedure Date  . Abdominal hysterectomy 07/2009  . Tubal ligation     Past  Medical Hx:  Past Medical History  Diagnosis Date  . Thyroid disease   . Hypertension   . Cancer     Family Hx:  Family History  Problem Relation Age of Onset  . Prostate cancer Other   . Cancer Maternal Aunt     Review of Systems:  Constitutional  Feels well reports weight loss because of diet and exercise. Skin No rash, sores, jaundice, itching, dryness,  Cardiovascular  No chest pain, shortness of breath, or edema  Pulmonary  No cough or wheeze.  Gastro Intestinal  No nausea, vomitting, or diarrhoea. No bright red blood per rectum, no abdominal pain, change in bowel movement, or constipation.  Genito Urinary  No frequency, urgency, dysuria, reports vaginal discharge when she does abdominal crunches. Musculo Skeletal  No myalgia, arthralgia, joint swelling or pain  Neurologic  No weakness, numbness, change in gait,  Psychology  No depression, anxiety, insomnia.     Vitals:  Blood pressure 142/80, pulse 78, temperature 98.6 F (37 C), resp. rate 16, height 5' 3.6" (1.615 m), weight  127 lb 9.6 oz (57.879 kg).  Physical Exam: WD female in no acute distress Neck  Supple without any enlargements.  Lymph node survey. No cervical supraclavicular cervical or inguinal adenopathy Cardiovascular  Pulse normal rate, regularity and rhythm. S1 and S2 normal. Lungs  Clear to auscultation bilateraly, without wheezes/crackles/rhonchi. Good air movement.  Skin  No rash/lesions/breakdown  Psychiatry  Alert and oriented to person, place, and time  Back No CVA tenderness Abdomen  Normoactive bowel sounds, abdomen soft, non-tender and obese. Surgical  sites intact without evidence of hernia.  Genito Urinary  Vulva/vagina: Normal external female genitalia.  No lesions.   Bladder/urethra:  No lesions or masses  Vagina: Right vaginal discharge consistent with candidiasis Rectal  Good tone, no masses no cul de sac nodularity.  Extremities  No bilateral cyanosis, clubbing or edema.    Assessment/Plan:  This is a 51 y.o. year old with stage IA grade 2 endometrial cancer. Staging took place on 08/16/2009 the she's been without any evidence of disease.  Since Dr. Okey Dupre is no longer in practice so she will followup with the GYN oncology service in 6 months.  An electronic prescription for Diflucan 100 mg was prescribed for management of vaginal candidiasis.  Karina Schimke, MD., PhD. 07/12/2011, 2:40 PM

## 2011-07-16 ENCOUNTER — Telehealth: Payer: Self-pay | Admitting: Gynecologic Oncology

## 2011-07-16 NOTE — Telephone Encounter (Signed)
Spoke with patient about Diflucan script given at office visit on 2/14.  Pt reporting no improvement in symptoms since taking the medication on Friday morning.  Instructed to use 5 or 7 day over the counter Monistat vaginal cream and to report in two to three days for worsening symptoms or no improvement.  Pt verbalized understanding.

## 2011-07-26 ENCOUNTER — Emergency Department (HOSPITAL_COMMUNITY): Payer: Medicare Other

## 2011-07-26 ENCOUNTER — Encounter (HOSPITAL_COMMUNITY): Payer: Self-pay | Admitting: Emergency Medicine

## 2011-07-26 ENCOUNTER — Emergency Department (HOSPITAL_COMMUNITY)
Admission: EM | Admit: 2011-07-26 | Discharge: 2011-07-26 | Discharge status: Against Medical Advice | Payer: Medicare Other | Attending: Emergency Medicine | Admitting: Emergency Medicine

## 2011-07-26 DIAGNOSIS — W19XXXA Unspecified fall, initial encounter: Secondary | ICD-10-CM | POA: Insufficient documentation

## 2011-07-26 DIAGNOSIS — R29898 Other symptoms and signs involving the musculoskeletal system: Secondary | ICD-10-CM | POA: Insufficient documentation

## 2011-07-26 DIAGNOSIS — Z8679 Personal history of other diseases of the circulatory system: Secondary | ICD-10-CM | POA: Insufficient documentation

## 2011-07-26 DIAGNOSIS — M79609 Pain in unspecified limb: Secondary | ICD-10-CM | POA: Insufficient documentation

## 2011-07-26 DIAGNOSIS — I10 Essential (primary) hypertension: Secondary | ICD-10-CM | POA: Insufficient documentation

## 2011-07-26 DIAGNOSIS — H538 Other visual disturbances: Secondary | ICD-10-CM | POA: Insufficient documentation

## 2011-07-26 DIAGNOSIS — R42 Dizziness and giddiness: Secondary | ICD-10-CM | POA: Insufficient documentation

## 2011-07-26 HISTORY — DX: Cerebral infarction, unspecified: I63.9

## 2011-07-26 NOTE — ED Notes (Signed)
Pt reports going to her PCP for a fall that happened on Saturday.  States that she has pain in her (R) leg.  Pt also reports weakness in her (R) arm and (R) leg.  Also reports that she has been seeing spots and has been dizzy intermittently.  Concerned that she might be having a CVA.  No distress noted.  Pt ambulatory without difficulty.

## 2011-07-26 NOTE — ED Notes (Addendum)
Pt s't s she fell on Sat. St's right leg has felt weaker than left.  Pt st's she is having pain in right leg and right arm since fall.  Pt has hx of CVA  Dr. Manus Gunning called to assess pt. No code stroke called.

## 2011-08-08 ENCOUNTER — Ambulatory Visit: Payer: Medicare Other | Admitting: Family Medicine

## 2011-10-05 ENCOUNTER — Other Ambulatory Visit (HOSPITAL_COMMUNITY)
Admission: RE | Admit: 2011-10-05 | Discharge: 2011-10-05 | Disposition: A | Discharge status: Home / Self Care | Payer: Medicare Other | Source: Ambulatory Visit | Attending: Obstetrics & Gynecology | Admitting: Obstetrics & Gynecology

## 2011-10-05 ENCOUNTER — Ambulatory Visit (INDEPENDENT_AMBULATORY_CARE_PROVIDER_SITE_OTHER): Payer: Medicare Other | Admitting: Obstetrics & Gynecology

## 2011-10-05 VITALS — BP 172/103 | HR 93 | Temp 98.0°F | Ht 63.5 in | Wt 124.2 lb

## 2011-10-05 DIAGNOSIS — Z124 Encounter for screening for malignant neoplasm of cervix: Secondary | ICD-10-CM | POA: Insufficient documentation

## 2011-10-05 DIAGNOSIS — C541 Malignant neoplasm of endometrium: Secondary | ICD-10-CM

## 2011-10-05 DIAGNOSIS — Z09 Encounter for follow-up examination after completed treatment for conditions other than malignant neoplasm: Secondary | ICD-10-CM

## 2011-10-05 DIAGNOSIS — A5901 Trichomonal vulvovaginitis: Secondary | ICD-10-CM

## 2011-10-05 DIAGNOSIS — Z08 Encounter for follow-up examination after completed treatment for malignant neoplasm: Secondary | ICD-10-CM

## 2011-10-05 DIAGNOSIS — C549 Malignant neoplasm of corpus uteri, unspecified: Secondary | ICD-10-CM

## 2011-10-05 DIAGNOSIS — Z8542 Personal history of malignant neoplasm of other parts of uterus: Secondary | ICD-10-CM

## 2011-10-05 DIAGNOSIS — Z1231 Encounter for screening mammogram for malignant neoplasm of breast: Secondary | ICD-10-CM

## 2011-10-05 NOTE — Progress Notes (Signed)
Patient ID: [name], female   DOB: 05-13-1961, 51 y.o.   MRN: [id number] Subjective:     [name] is a 51 y.o. woman who comes in today for a  pap smear only. Her most recent annual exam was on 10/2010. Her most recent Pap smear was on 10/2010 and showed NEGATIVE FOR INTRAEPITHELIAL LESIONS OR MALIGNANCY. Previous abnormal Pap smears: no. She does have a history of endometrial cancer. She follows up with gynecology oncology every 6 months. Contraception: status post hysterectomy. She reports some bladder spasms for which she is see a urologist. She denies vaginal bleeding, discharge or irritation.   Review of Systems Pertinent items are noted in HPI.   Objective:    BP 172/103  Pulse 93  Temp(Src) 98 F (36.7 C) (Oral)  Ht 5' 3.5" (1.613 m)  Wt 124 lb 3.2 oz (56.337 kg)  BMI 21.66 kg/m2 Pelvic Exam: external genitalia normal and normal vagina with vaginal cuff. No cervix. Vagina cuff scraped for cells. . Pap smear obtained.   Assessment:    Screening pap smear s/p hysterectomy for endometrial adenocarcinoma.   Plan:  To discuss recommended screening interval with gynecology-oncology.   Follow up in 6 months, or as indicated by Pap results.   Attestation of Attending Supervision of Resident: Evaluation and management procedures were performed by the [facility] Medicine Resident under my supervision.  I have seen and examined the patient, reviewed the resident's note and chart, and I agree with management and plan.  Jaynie Collins, M.D. 10/05/2011 12:00 PM

## 2011-10-05 NOTE — Patient Instructions (Signed)
Sela,  Thank you for coming in today. Please get your screening mammogram. Pease follow up in 6 months for repeat pap or sooner if needed.   Drs. Rini Moffit and Anyanwu

## 2011-10-11 ENCOUNTER — Telehealth: Payer: Self-pay

## 2011-10-11 DIAGNOSIS — A5901 Trichomonal vulvovaginitis: Secondary | ICD-10-CM

## 2011-10-11 MED ORDER — METRONIDAZOLE 500 MG PO TABS: ORAL_TABLET | ORAL | Status: DC

## 2011-10-11 NOTE — Telephone Encounter (Signed)
Pt returned our phone call. I advised her of her diagnosis and need for antibiotics. Pt is currently out of town and asked that the medicine be sent to [facility] in [place]. Pt advised that patient and her partner needs to be treated. Pt agrees with plan and will get medicine.

## 2011-10-11 NOTE — Progress Notes (Signed)
Addended by: Jaynie Collins A on: 10/11/2011 08:31 AM   Modules accepted: Orders

## 2011-10-11 NOTE — Telephone Encounter (Signed)
Message copied by Faythe Casa on Thu Oct 11, 2011  8:29 AM ------      Message from: Jaynie Collins A      Created: Thu Oct 11, 2011  8:27 AM       Pap smear showed trichomonas.  Metronidazole ordered for patient; she needs to be informed of diagnosis and also to inform her partner(s) to get evaluated and treated.            Jaynie Collins, M.D.      10/11/2011 8:25 AM

## 2011-10-11 NOTE — Progress Notes (Signed)
Quick Note:  Pap smear showed trichomonas. Metronidazole ordered for patient; she needs to be informed of diagnosis and also to inform her partner(s) to get evaluated and treated.  Jaynie Collins, M.D. 10/11/2011 8:25 AM   ______

## 2011-10-11 NOTE — Telephone Encounter (Signed)
Called pt and left message to return our call to the clinics.  

## 2011-10-12 ENCOUNTER — Telehealth: Payer: Self-pay | Admitting: *Deleted

## 2011-10-12 NOTE — Telephone Encounter (Signed)
Patient called stating she had not been notified by [facility] that prescription was ready and asked if it had been sent- verified per chart review it was sent - informed her we would call and make sure was received. Called [facility] and heard message it was closed

## 2011-10-12 NOTE — Telephone Encounter (Signed)
I spoke with [facility] they said medicine is ready for patient to pick up. Called patient and let her know to go get her medicine.

## 2011-10-19 ENCOUNTER — Ambulatory Visit
Admission: RE | Admit: 2011-10-19 | Discharge: 2011-10-19 | Disposition: A | Discharge status: Home / Self Care | Payer: Medicare Other | Source: Ambulatory Visit | Attending: Obstetrics & Gynecology | Admitting: Obstetrics & Gynecology

## 2011-10-19 DIAGNOSIS — Z1231 Encounter for screening mammogram for malignant neoplasm of breast: Secondary | ICD-10-CM

## 2012-01-03 ENCOUNTER — Ambulatory Visit: Payer: Medicare Other | Attending: Gynecologic Oncology | Admitting: Gynecologic Oncology

## 2012-01-03 ENCOUNTER — Encounter: Payer: Self-pay | Admitting: Gynecologic Oncology

## 2012-01-03 VITALS — BP 128/80 | HR 62 | Temp 98.0°F | Resp 18 | Ht 64.02 in | Wt 128.7 lb

## 2012-01-03 DIAGNOSIS — Z8542 Personal history of malignant neoplasm of other parts of uterus: Secondary | ICD-10-CM | POA: Insufficient documentation

## 2012-01-03 DIAGNOSIS — I10 Essential (primary) hypertension: Secondary | ICD-10-CM | POA: Insufficient documentation

## 2012-01-03 DIAGNOSIS — C541 Malignant neoplasm of endometrium: Secondary | ICD-10-CM

## 2012-01-03 NOTE — Progress Notes (Signed)
Follow-up Note: Gyn-Onc   [name] 51 y.o. female  CC:  Chief Complaint  Patient presents with  . Endometrial cancer    follow up    ZOX:WRUE is a 51 year old female, diagnosed with a grade I endometrial cancer.  On August 16, 2009, she underwent laparoscopic hysterectomy and bilateral salpingo-oophorectomy. 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    Interval History: A pap test 09/2011 was wnl.  Patient is without complaints.    Allergy:  Allergies  Allergen Reactions  . Penicillins     "breakout and itch"    Past Surgical Hx:  Past Surgical History  Procedure Date  . Abdominal hysterectomy 07/2009  . Tubal ligation     Past Medical Hx:  Past Medical History  Diagnosis Date  . Thyroid disease   . Hypertension   . Cancer   . CVA (cerebrovascular accident) 2007    Family Hx:  Family History  Problem Relation Age of Onset  . Prostate cancer Other   . Cancer Maternal Aunt     Review of Systems:  Constitutional  Feels well reports weight loss because of diet and exercise. Skin No rash, sores, jaundice, itching, dryness,  Cardiovascular  No chest pain, shortness of breath, or edema  Pulmonary  No cough or wheeze.  Gastro Intestinal  No nausea, vomitting, or diarrhoea. No bright red blood per rectum, no abdominal pain, change in bowel movement, or constipation.  Genito Urinary  No frequency, urgency, dysuria, no vaginal bleeding or discharge.  Musculo Skeletal  No myalgia, arthralgia, joint swelling or pain  Neurologic  No weakness, numbness, change in gait,  Psychology  No depression, anxiety, insomnia.     Vitals:  Blood pressure 128/80, pulse 62, temperature 98 F (36.7 C), temperature source Oral, resp. rate 18, height 5' 4.02" (1.626 m), weight 128 lb 11.2 oz (58.378 kg).  Physical Exam: WD female in no acute distress Neck  Supple without any enlargements.  Lymph node survey. No cervical  supraclavicular cervical or inguinal adenopathy Cardiovascular  Pulse normal rate, regularity and rhythm. S1 and S2 normal. Lungs  Clear to auscultation bilateraly, without wheezes/crackles/rhonchi. Good air movement.  Skin  No rash/lesions/breakdown  Psychiatry  Alert and oriented to person, place, and time  Back No CVA tenderness Abdomen  Normoactive bowel sounds, abdomen soft, non-tender and obese. Surgical  sites intact without evidence of hernia.  Genito Urinary  Vulva/vagina: Normal external female genitalia.  No lesions.   Bladder/urethra:  No lesions or masses  Vagina: atrophic, loss of rugae, no vaginal blood or discharge.  No cul de sac nodularity. Rectal  Good tone, no masses no cul de sac nodularity.  Extremities  No bilateral cyanosis, clubbing or edema.    Assessment/Plan:  This is a 51 y.o. year old with stage IA grade 2 endometrial cancer. Staging took place on 08/16/2009 the she's been without any evidence of disease.    F/U in six months   Karina Schimke, MD., PhD. 01/03/2012, 5:04 PM

## 2012-01-03 NOTE — Patient Instructions (Addendum)
No evidence of disease.    F/U in six months  Thank you very much [name] for allowing me to provide care for you today.  I appreciate your confidence in choosing our Gynecologic Oncology team.  If you have any questions about your visit today please call our office and we will get back to you as soon as possible.  [name]. Jolynda Townley MD., PhD Gynecologic Oncology

## 2012-04-09 ENCOUNTER — Ambulatory Visit: Payer: Medicare Other | Admitting: Obstetrics & Gynecology

## 2012-04-21 ENCOUNTER — Encounter: Payer: Self-pay | Admitting: Obstetrics and Gynecology

## 2012-04-21 ENCOUNTER — Ambulatory Visit (INDEPENDENT_AMBULATORY_CARE_PROVIDER_SITE_OTHER): Payer: Medicare Other | Admitting: Obstetrics and Gynecology

## 2012-04-21 ENCOUNTER — Other Ambulatory Visit (HOSPITAL_COMMUNITY)
Admission: RE | Admit: 2012-04-21 | Discharge: 2012-04-21 | Disposition: A | Discharge status: Home / Self Care | Payer: Medicare Other | Source: Ambulatory Visit | Attending: Obstetrics and Gynecology | Admitting: Obstetrics and Gynecology

## 2012-04-21 VITALS — BP 163/102 | HR 114 | Temp 97.4°F | Ht 64.0 in | Wt 151.0 lb

## 2012-04-21 DIAGNOSIS — Z124 Encounter for screening for malignant neoplasm of cervix: Secondary | ICD-10-CM | POA: Insufficient documentation

## 2012-04-21 DIAGNOSIS — Z23 Encounter for immunization: Secondary | ICD-10-CM

## 2012-04-21 DIAGNOSIS — Z1151 Encounter for screening for human papillomavirus (HPV): Secondary | ICD-10-CM | POA: Insufficient documentation

## 2012-04-21 DIAGNOSIS — C541 Malignant neoplasm of endometrium: Secondary | ICD-10-CM

## 2012-04-21 DIAGNOSIS — C549 Malignant neoplasm of corpus uteri, unspecified: Secondary | ICD-10-CM

## 2012-04-21 MED ORDER — INFLUENZA VIRUS VACC SPLIT PF IM SUSP
0.5000 mL | Freq: Once | INTRAMUSCULAR | Status: AC
  Administered 2012-04-21: 0.5 mL via INTRAMUSCULAR

## 2012-04-21 NOTE — Progress Notes (Signed)
Patient ID: [name], female   DOB: 1960-10-02, 51 y.o.   MRN: [id number] 51 yo G2P1002 with history of endometrial cancer presenting today for pap smear. Patient is doing well and without any complaints.   Past Medical History  Diagnosis Date  . Thyroid disease   . Hypertension   . CVA (cerebrovascular accident) 2007  . Cancer     endometrial   Past Surgical History  Procedure Date  . Abdominal hysterectomy 07/2009  . Tubal ligation   . Salpingoophorectomy    History  Substance Use Topics  . Smoking status: Current Every Day Smoker -- 0.2 packs/day    Types: Cigarettes  . Smokeless tobacco: Never Used  . Alcohol Use: No   Family History  Problem Relation Age of Onset  . Prostate cancer Other   . Cancer Maternal Aunt    GENERAL: Well-developed, well-nourished female in no acute distress.  HEENT: Normocephalic, atraumatic. Sclerae anicteric.  NECK: Supple. Normal thyroid.  LUNGS: Clear to auscultation bilaterally.  HEART: Regular rate and rhythm. BREASTS: Symmetric in size. No palpable masses or lymphadenopathy, skin changes, or nipple drainage. ABDOMEN: Soft, nontender, nondistended. No organomegaly. PELVIC: Normal external female genitalia. Vagina is pink and rugated.  Normal discharge. Normal appearing cervix. Uterus is normal in size.  No adnexal mass or tenderness. EXTREMITIES: No cyanosis, clubbing, or edema, 2+ distal pulses.  A/P 51 yo here for annual exam - vaginal pap smear performed - patient with elevated BP- advised to follow-up with primary care physician as soon as possible - patient reports normal mammogram this year - RTC in 1 year unless gyn onc wants repeat pap in 6 months

## 2012-06-17 ENCOUNTER — Encounter: Payer: Self-pay | Admitting: Gynecologic Oncology

## 2012-06-17 ENCOUNTER — Ambulatory Visit: Payer: Medicare Other | Attending: Gynecologic Oncology | Admitting: Gynecologic Oncology

## 2012-06-17 VITALS — BP 120/68 | HR 84 | Temp 98.8°F | Resp 20 | Ht 64.02 in | Wt 152.4 lb

## 2012-06-17 DIAGNOSIS — Z8673 Personal history of transient ischemic attack (TIA), and cerebral infarction without residual deficits: Secondary | ICD-10-CM | POA: Insufficient documentation

## 2012-06-17 DIAGNOSIS — C549 Malignant neoplasm of corpus uteri, unspecified: Secondary | ICD-10-CM | POA: Insufficient documentation

## 2012-06-17 DIAGNOSIS — Z90710 Acquired absence of both cervix and uterus: Secondary | ICD-10-CM | POA: Insufficient documentation

## 2012-06-17 DIAGNOSIS — I10 Essential (primary) hypertension: Secondary | ICD-10-CM | POA: Insufficient documentation

## 2012-06-17 DIAGNOSIS — C541 Malignant neoplasm of endometrium: Secondary | ICD-10-CM

## 2012-06-17 NOTE — Progress Notes (Signed)
Follow-up Note: Gyn-Onc   [name] 52 y.o. female  CC:  Chief Complaint  Patient presents with  . Endometrial cancer    Follow up    WUJ:[id number] is a 52 year old female, diagnosed with a grade I endometrial cancer.  On August 16, 2009, she underwent laparoscopic hysterectomy and bilateral salpingo-oophorectomy. 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    Interval History: A pap test 09/2011 was wnl.  Patient is without complaints.  Has increased her tobacco use.  Past Surgical Hx:  Past Surgical History  Procedure Date  . Abdominal hysterectomy 07/2009  . Tubal ligation   . Salpingoophorectomy     Past Medical Hx:  Past Medical History  Diagnosis Date  . Thyroid disease   . Hypertension   . CVA (cerebrovascular accident) 2007  . Cancer     endometrial    Family Hx:  Family History  Problem Relation Age of Onset  . Prostate cancer Other   . Cancer Maternal Aunt     Review of Systems:  Constitutional  Feels well reports plans to promote weight loss by using a new exercise machine she received for Christmas  Cardiovascular  No chest pain, shortness of breath, or edema  Pulmonary  No cough or wheeze.  Gastro Intestinal  No nausea, vomitting, or diarrhoea. No bright red blood per rectum, no abdominal pain, change in bowel movement, or constipation.  Genito Urinary  No frequency, urgency, dysuria, no vaginal bleeding or discharge.  Musculo Skeletal  No myalgia, arthralgia, joint swelling or pain  Neurologic  No weakness, numbness, change in gait,  Psychology  No depression, anxiety, insomnia.     Vitals:  Blood pressure 120/68, pulse 84, temperature 98.8 F (37.1 C), temperature source Oral, resp. rate 20, height 5' 4.02" (1.626 m), weight 152 lb 6.4 oz (69.128 kg).  Physical Exam: WD female in no acute distress Neck  Supple without any enlargements.  Lymph node survey. No cervical supraclavicular cervical or  inguinal adenopathy Cardiovascular  Pulse normal rate, regularity and rhythm.   Lungs  Clear to auscultation bilateraly, without wheezes/crackles/rhonchi.  Psychiatry  Alert and oriented to person, place, and time  Back No CVA tenderness Abdomen  Normoactive bowel sounds, abdomen soft, non-tender and obese. Surgical  sites intact without evidence of hernia.  Genito Urinary  Vulva/vagina: Normal external female genitalia.  No lesions.   Bladder/urethra:  No lesions or masses  Vagina: atrophic, loss of rugae, no vaginal blood or discharge.  No cul de sac nodularity. Rectal  Good tone, no masses no cul de sac nodularity.  Extremities  No bilateral cyanosis, clubbing or edema.    Assessment/Plan:  This is a 53 y.o. year old with stage IA grade 2 endometrial cancer. Staging took place on 08/16/2009 and she's been without any evidence of disease.  Pap test in November 2013 was within normal limits.  The patient is now over 3 years without any evidence of recurrent disease. I've asked her to followup with Dr. Gigi Gin constant annually. She'll followup with GYN oncology prn pertinent symptomatology.    Laurette Schimke, MD., PhD. 06/17/2012, 4:21 PM

## 2012-06-17 NOTE — Patient Instructions (Addendum)
Over 3 years without any evidence of recurrent disease.  Followup with Dr. Gigi Gin constant annually.  Followup with GYN oncology prn pertinent symptomatology

## 2012-12-12 ENCOUNTER — Other Ambulatory Visit: Payer: Self-pay

## 2012-12-12 DIAGNOSIS — Z1231 Encounter for screening mammogram for malignant neoplasm of breast: Secondary | ICD-10-CM

## 2013-01-09 ENCOUNTER — Ambulatory Visit
Admission: RE | Admit: 2013-01-09 | Discharge: 2013-01-09 | Disposition: A | Discharge status: Home / Self Care | Payer: Medicare HMO | Source: Ambulatory Visit

## 2013-01-09 DIAGNOSIS — Z1231 Encounter for screening mammogram for malignant neoplasm of breast: Secondary | ICD-10-CM

## 2013-07-01 ENCOUNTER — Other Ambulatory Visit (HOSPITAL_COMMUNITY)
Admission: RE | Admit: 2013-07-01 | Discharge: 2013-07-01 | Disposition: A | Discharge status: Home / Self Care | Payer: Medicare HMO | Source: Ambulatory Visit | Attending: Obstetrics & Gynecology | Admitting: Obstetrics & Gynecology

## 2013-07-01 ENCOUNTER — Ambulatory Visit (INDEPENDENT_AMBULATORY_CARE_PROVIDER_SITE_OTHER): Payer: Medicare HMO | Admitting: Obstetrics & Gynecology

## 2013-07-01 ENCOUNTER — Encounter: Payer: Self-pay | Admitting: Obstetrics & Gynecology

## 2013-07-01 DIAGNOSIS — Z124 Encounter for screening for malignant neoplasm of cervix: Secondary | ICD-10-CM | POA: Insufficient documentation

## 2013-07-01 DIAGNOSIS — Z01419 Encounter for gynecological examination (general) (routine) without abnormal findings: Secondary | ICD-10-CM

## 2013-07-01 DIAGNOSIS — Z1151 Encounter for screening for human papillomavirus (HPV): Secondary | ICD-10-CM | POA: Insufficient documentation

## 2013-07-01 NOTE — Progress Notes (Signed)
Patient ID: [name], female   DOB: [birth year], 53 y.o.   MRN: [id number] Subjective:     [name] is a 53 y.o. female here for a routine exam.  Current complaints: Pt with no GYN comlaints.     Gynecologic History No LMP recorded. Patient has had a hysterectomy. Contraception: status post hysterectomy Last Pap: 2013. Results were: normal Last mammogram: 12/2012. Results were: normal  Obstetric History OB History  Gravida Para Term Preterm AB SAB TAB Ectopic Multiple Living  2 2 1       2     # Outcome Date GA Lbr Len/2nd Weight Sex Delivery Anes PTL Lv  2 PAR      SVD     1 TRM  [redacted]w[redacted]d    SVD          The following portions of the patient's history were reviewed and updated as appropriate: allergies, current medications, past family history, past medical history, past social history, past surgical history and problem list.  Review of Systems A comprehensive review of systems was negative.    Objective:    There were no vitals taken for this visit.  General Appearance:    Alert, cooperative, no distress, appears stated age                       Lungs:     Clear to auscultation bilaterally, respirations unlabored  Chest Wall:    No tenderness or deformity   Heart:    Regular rate and rhythm, S1 and S2 normal, no murmur, rub   or gallop  Breast Exam:    No tenderness, masses, or nipple abnormality  Abdomen:     Soft, non-tender, bowel sounds active all four quadrants,    no masses, no organomegaly  Genitalia:    Normal female without lesion, discharge or tenderness; vaginal cuff well healed.  No masses at cuff or in adnexa.     Extremities:   Extremities normal, atraumatic, no cyanosis or edema  Pulses:   2+ and symmetric all extremities  Skin:   Skin color, texture, turgor normal, no rashes or lesions            Assessment:    Healthy female exam.  H/o endometrial cancer   Plan:    Follow up in: 1 year.   F/u PAP of cuff rec tobacco cessation

## 2013-07-01 NOTE — Patient Instructions (Signed)
Smoking cessation tips Smoking Cessation, Tips for Success If you are ready to quit smoking, congratulations! You have chosen to help yourself be healthier. Cigarettes bring nicotine, tar, carbon monoxide, and other irritants into your body. Your lungs, heart, and blood vessels will be able to work better without these poisons. There are many different ways to quit smoking. Nicotine gum, nicotine patches, a nicotine inhaler, or nicotine nasal spray can help with physical craving. Hypnosis, support groups, and medicines help break the habit of smoking. WHAT THINGS CAN I DO TO MAKE QUITTING EASIER?  Here are some tips to help you quit for good:  Pick a date when you will quit smoking completely. Tell all of your friends and family about your plan to quit on that date.  Do not try to slowly cut down on the number of cigarettes you are smoking. Pick a quit date and quit smoking completely starting on that day.  Throw away all cigarettes.   Clean and remove all ashtrays from your home, work, and car.   On a card, write down your reasons for quitting. Carry the card with you and read it when you get the urge to smoke.   Cleanse your body of nicotine. Drink enough water and fluids to keep your urine clear or pale yellow. Do this after quitting to flush the nicotine from your body.   Learn to predict your moods. Do not let a bad situation be your excuse to have a cigarette. Some situations in your life might tempt you into wanting a cigarette.   Never have "just one" cigarette. It leads to wanting another and another. Remind yourself of your decision to quit.   Change habits associated with smoking. If you smoked while driving or when feeling stressed, try other activities to replace smoking. Stand up when drinking your coffee. Brush your teeth after eating. Sit in a different chair when you read the paper. Avoid alcohol while trying to quit, and try to drink fewer caffeinated beverages. Alcohol  and caffeine may urge you to smoke.   Avoid foods and drinks that can trigger a desire to smoke, such as sugary or spicy foods and alcohol.   Ask people who smoke not to smoke around you.   Have something planned to do right after eating or having a cup of coffee. For example, plan to take a walk or exercise.   Try a relaxation exercise to calm you down and decrease your stress. Remember, you may be tense and nervous for the first 2 weeks after you quit, but this will pass.   Find new activities to keep your hands busy. Play with a pen, coin, or rubber band. Doodle or draw things on paper.   Brush your teeth right after eating. This will help cut down on the craving for the taste of tobacco after meals. You can also try mouthwash.   Use oral substitutes in place of cigarettes. Try using lemon drops, carrots, cinnamon sticks, or chewing gum. Keep them handy so they are available when you have the urge to smoke.   When you have the urge to smoke, try deep breathing.   Designate your home as a nonsmoking area.   If you are a heavy smoker, ask your health care provider about a prescription for nicotine chewing gum. It can ease your withdrawal from nicotine.   Reward yourself. Set aside the cigarette money you save and buy yourself something nice.   Look for support from others. Join a  support group or smoking cessation program. Ask someone at home or at work to help you with your plan to quit smoking.   Always ask yourself, "Do I need this cigarette or is this just a reflex?" Tell yourself, "Today, I choose not to smoke," or "I do not want to smoke." You are reminding yourself of your decision to quit.  Do not replace cigarette smoking with electronic cigarettes (commonly called e-cigarettes). The safety of e-cigarettes is unknown, and some may contain harmful chemicals.  If you relapse, do not give up! Plan ahead and think about what you will do the next time you get the urge  to smoke.  HOW WILL I FEEL WHEN I QUIT SMOKING? You may have symptoms of withdrawal because your body is used to nicotine (the addictive substance in cigarettes). You may crave cigarettes, be irritable, feel very hungry, cough often, get headaches, or have difficulty concentrating. The withdrawal symptoms are only temporary. They are strongest when you first quit but will go away within 10 14 days. When withdrawal symptoms occur, stay in control. Think about your reasons for quitting. Remind yourself that these are signs that your body is healing and getting used to being without cigarettes. Remember that withdrawal symptoms are easier to treat than the major diseases that smoking can cause.  Even after the withdrawal is over, expect periodic urges to smoke. However, these cravings are generally short lived and will go away whether you smoke or not. Do not smoke!  WHAT RESOURCES ARE AVAILABLE TO HELP ME QUIT SMOKING? Your health care provider can direct you to community resources or hospitals for support, which may include:  Group support.  Education.  Hypnosis.  Therapy. Document Released: 02/10/2004 Document Revised: 03/04/2013 Document Reviewed: 10/30/2012 [facility] Patient Information 2014 [place], [place]. Smoking Cessation Quitting smoking is important to your health and has many advantages. However, it is not always easy to quit since nicotine is a very addictive drug. Often times, people try 3 times or more before being able to quit. This document explains the best ways for you to prepare to quit smoking. Quitting takes hard work and a lot of effort, but you can do it. ADVANTAGES OF QUITTING SMOKING  You will live longer, feel better, and live better.  Your body will feel the impact of quitting smoking almost immediately.  Within 20 minutes, blood pressure decreases. Your pulse returns to its normal level.  After 8 hours, carbon monoxide levels in the blood return to normal. Your  oxygen level increases.  After 24 hours, the chance of having a heart attack starts to decrease. Your breath, hair, and body stop smelling like smoke.  After 48 hours, damaged nerve endings begin to recover. Your sense of taste and smell improve.  After 72 hours, the body is virtually free of nicotine. Your bronchial tubes relax and breathing becomes easier.  After 2 to 12 weeks, lungs can hold more air. Exercise becomes easier and circulation improves.  The risk of having a heart attack, stroke, cancer, or lung disease is greatly reduced.  After 1 year, the risk of coronary heart disease is cut in half.  After 5 years, the risk of stroke falls to the same as a nonsmoker.  After 10 years, the risk of lung cancer is cut in half and the risk of other cancers decreases significantly.  After 15 years, the risk of coronary heart disease drops, usually to the level of a nonsmoker.  If you are pregnant, quitting smoking  will improve your chances of having a healthy baby.  The people you live with, especially any children, will be healthier.  You will have extra money to spend on things other than cigarettes. QUESTIONS TO THINK ABOUT BEFORE ATTEMPTING TO QUIT You may want to talk about your answers with your caregiver.  Why do you want to quit?  If you tried to quit in the past, what helped and what did not?  What will be the most difficult situations for you after you quit? How will you plan to handle them?  Who can help you through the tough times? Your family? Friends? A caregiver?  What pleasures do you get from smoking? What ways can you still get pleasure if you quit? Here are some questions to ask your caregiver:  How can you help me to be successful at quitting?  What medicine do you think would be best for me and how should I take it?  What should I do if I need more help?  What is smoking withdrawal like? How can I get information on withdrawal? GET READY  Set a quit  date.  Change your environment by getting rid of all cigarettes, ashtrays, matches, and lighters in your home, car, or work. Do not let people smoke in your home.  Review your past attempts to quit. Think about what worked and what did not. GET SUPPORT AND ENCOURAGEMENT You have a better chance of being successful if you have help. You can get support in many ways.  Tell your family, friends, and co-workers that you are going to quit and need their support. Ask them not to smoke around you.  Get individual, group, or telephone counseling and support. Programs are available at [employer] and health centers. Call your local health department for information about programs in your area.  Spiritual beliefs and practices may help some smokers quit.  Download a "quit meter" on your computer to keep track of quit statistics, such as how long you have gone without smoking, cigarettes not smoked, and money saved.  Get a self-help book about quitting smoking and staying off of tobacco. [place] yourself from urges to smoke. Talk to someone, go for a walk, or occupy your time with a task.  Change your normal routine. Take a different route to work. Drink tea instead of coffee. Eat breakfast in a different place.  Reduce your stress. Take a hot bath, exercise, or read a book.  Plan something enjoyable to do every day. Reward yourself for not smoking.  Explore interactive web-based programs that specialize in helping you quit. GET MEDICINE AND USE IT CORRECTLY Medicines can help you stop smoking and decrease the urge to smoke. Combining medicine with the above behavioral methods and support can greatly increase your chances of successfully quitting smoking.  Nicotine replacement therapy helps deliver nicotine to your body without the negative effects and risks of smoking. Nicotine replacement therapy includes nicotine gum, lozenges, inhalers, nasal sprays, and  skin patches. Some may be available over-the-counter and others require a prescription.  Antidepressant medicine helps people abstain from smoking, but how this works is unknown. This medicine is available by prescription.  Nicotinic receptor partial agonist medicine simulates the effect of nicotine in your brain. This medicine is available by prescription. Ask your caregiver for advice about which medicines to use and how to use them based on your health history. Your caregiver will tell you what side effects to look out  for if you choose to be on a medicine or therapy. Carefully read the information on the package. Do not use any other product containing nicotine while using a nicotine replacement product.  RELAPSE OR DIFFICULT SITUATIONS Most relapses occur within the first 3 months after quitting. Do not be discouraged if you start smoking again. Remember, most people try several times before finally quitting. You may have symptoms of withdrawal because your body is used to nicotine. You may crave cigarettes, be irritable, feel very hungry, cough often, get headaches, or have difficulty concentrating. The withdrawal symptoms are only temporary. They are strongest when you first quit, but they will go away within 10 14 days. To reduce the chances of relapse, try to:  Avoid drinking alcohol. Drinking lowers your chances of successfully quitting.  Reduce the amount of caffeine you consume. Once you quit smoking, the amount of caffeine in your body increases and can give you symptoms, such as a rapid heartbeat, sweating, and anxiety.  Avoid smokers because they can make you want to smoke.  Do not let weight gain distract you. Many smokers will gain weight when they quit, usually less than 10 pounds. Eat a healthy diet and stay active. You can always lose the weight gained after you quit.  Find ways to improve your mood other than smoking. FOR MORE INFORMATION  www.smokefree.gov  Document  Released: 05/08/2001 Document Revised: 11/13/2011 Document Reviewed: 08/23/2011 [facility] Patient Information 2014 [place], [place].

## 2013-11-27 ENCOUNTER — Emergency Department (HOSPITAL_COMMUNITY)
Admission: EM | Admit: 2013-11-27 | Discharge: 2013-11-27 | Disposition: A | Discharge status: Home / Self Care | Payer: No Typology Code available for payment source | Attending: Emergency Medicine | Admitting: Emergency Medicine

## 2013-11-27 ENCOUNTER — Emergency Department (HOSPITAL_COMMUNITY): Payer: No Typology Code available for payment source

## 2013-11-27 ENCOUNTER — Encounter (HOSPITAL_COMMUNITY): Payer: Self-pay | Admitting: Emergency Medicine

## 2013-11-27 DIAGNOSIS — F329 Major depressive disorder, single episode, unspecified: Secondary | ICD-10-CM | POA: Diagnosis not present

## 2013-11-27 DIAGNOSIS — I679 Cerebrovascular disease, unspecified: Secondary | ICD-10-CM | POA: Diagnosis not present

## 2013-11-27 DIAGNOSIS — Z79899 Other long term (current) drug therapy: Secondary | ICD-10-CM | POA: Diagnosis not present

## 2013-11-27 DIAGNOSIS — M545 Low back pain, unspecified: Secondary | ICD-10-CM | POA: Diagnosis not present

## 2013-11-27 DIAGNOSIS — Y92410 Unspecified street and highway as the place of occurrence of the external cause: Secondary | ICD-10-CM | POA: Diagnosis not present

## 2013-11-27 DIAGNOSIS — Z880 Allergy status to penicillin: Secondary | ICD-10-CM | POA: Insufficient documentation

## 2013-11-27 DIAGNOSIS — F3130 Bipolar disorder, current episode depressed, mild or moderate severity, unspecified: Secondary | ICD-10-CM | POA: Diagnosis not present

## 2013-11-27 DIAGNOSIS — F4310 Post-traumatic stress disorder, unspecified: Secondary | ICD-10-CM | POA: Diagnosis not present

## 2013-11-27 DIAGNOSIS — F17200 Nicotine dependence, unspecified, uncomplicated: Secondary | ICD-10-CM | POA: Insufficient documentation

## 2013-11-27 DIAGNOSIS — Y9389 Activity, other specified: Secondary | ICD-10-CM | POA: Diagnosis not present

## 2013-11-27 DIAGNOSIS — M542 Cervicalgia: Secondary | ICD-10-CM | POA: Insufficient documentation

## 2013-11-27 DIAGNOSIS — I10 Essential (primary) hypertension: Secondary | ICD-10-CM | POA: Insufficient documentation

## 2013-11-27 DIAGNOSIS — Z862 Personal history of diseases of the blood and blood-forming organs and certain disorders involving the immune mechanism: Secondary | ICD-10-CM | POA: Diagnosis not present

## 2013-11-27 DIAGNOSIS — Z8639 Personal history of other endocrine, nutritional and metabolic disease: Secondary | ICD-10-CM | POA: Insufficient documentation

## 2013-11-27 DIAGNOSIS — Z7982 Long term (current) use of aspirin: Secondary | ICD-10-CM | POA: Insufficient documentation

## 2013-11-27 DIAGNOSIS — R51 Headache: Secondary | ICD-10-CM | POA: Diagnosis present

## 2013-11-27 DIAGNOSIS — F3289 Other specified depressive episodes: Secondary | ICD-10-CM | POA: Diagnosis not present

## 2013-11-27 HISTORY — DX: Depression, unspecified: F32.A

## 2013-11-27 HISTORY — DX: Post-traumatic stress disorder, unspecified: F43.10

## 2013-11-27 HISTORY — DX: Major depressive disorder, single episode, unspecified: F32.9

## 2013-11-27 HISTORY — DX: Bipolar disorder, unspecified: F31.9

## 2013-11-27 LAB — BASIC METABOLIC PANEL
Anion gap: 14 (ref 5–15)
BUN: 11 mg/dL (ref 6–23)
CALCIUM: 9.5 mg/dL (ref 8.4–10.5)
CO2: 26 mEq/L (ref 19–32)
Chloride: 106 mEq/L (ref 96–112)
Creatinine, Ser: 0.78 mg/dL (ref 0.50–1.10)
GFR calc Af Amer: >90 mL/min (ref >90)
GFR calc non Af Amer: >90 mL/min (ref >90)
Glucose, Bld: 104 mg/dL — ABNORMAL HIGH (ref 70–99)
Potassium: 4.1 mEq/L (ref 3.7–5.3)
SODIUM: 146 meq/L (ref 137–147)

## 2013-11-27 LAB — CBC
HCT: 47.5 % — ABNORMAL HIGH (ref 36.0–46.0)
Hemoglobin: 15.8 g/dL — ABNORMAL HIGH (ref 12.0–15.0)
MCH: 28.3 pg (ref 26.0–34.0)
MCHC: 33.3 g/dL (ref 30.0–36.0)
MCV: 85.1 fL (ref 78.0–100.0)
PLATELETS: 155 10*3/uL (ref 150–400)
RBC: 5.58 MIL/uL — AB (ref 3.87–5.11)
RDW: 17.1 % — ABNORMAL HIGH (ref 11.5–15.5)
WBC: 7.2 10*3/uL (ref 4.0–10.5)

## 2013-11-27 LAB — RAPID URINE DRUG SCREEN, HOSP PERFORMED
AMPHETAMINES: NOT DETECTED
Barbiturates: NOT DETECTED
Benzodiazepines: POSITIVE — AB
COCAINE: NOT DETECTED
OPIATES: NOT DETECTED
TETRAHYDROCANNABINOL: POSITIVE — AB

## 2013-11-27 LAB — ETHANOL: Alcohol, Ethyl (B): <11 mg/dL (ref 0–11)

## 2013-11-27 MED ORDER — CLONIDINE HCL 0.2 MG PO TABS
0.2000 mg | ORAL_TABLET | Freq: Once | ORAL | Status: AC
  Administered 2013-11-27: 0.2 mg via ORAL
  Filled 2013-11-27: qty 1

## 2013-11-27 MED ORDER — DILTIAZEM HCL ER COATED BEADS 240 MG PO CP24
240.0000 mg | ORAL_CAPSULE | Freq: Once | ORAL | Status: AC
  Administered 2013-11-27: 240 mg via ORAL
  Filled 2013-11-27: qty 1

## 2013-11-27 MED ORDER — ACETAMINOPHEN 500 MG PO TABS
1000.0000 mg | ORAL_TABLET | Freq: Once | ORAL | Status: AC
  Administered 2013-11-27: 1000 mg via ORAL
  Filled 2013-11-27: qty 2

## 2013-11-27 NOTE — ED Notes (Signed)
MD at bedside. 
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
## 2013-11-27 NOTE — ED Notes (Signed)
[employer] Officer at bedside. 
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
## 2013-11-27 NOTE — ED Notes (Signed)
Sister Ardeth Sportsman [phone]
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
## 2013-11-27 NOTE — ED Notes (Signed)
Pt is tearful and cooperative.

## 2013-12-01 ENCOUNTER — Emergency Department (HOSPITAL_COMMUNITY): Payer: No Typology Code available for payment source

## 2013-12-01 ENCOUNTER — Encounter (HOSPITAL_COMMUNITY): Payer: Self-pay | Admitting: Emergency Medicine

## 2013-12-01 ENCOUNTER — Emergency Department (HOSPITAL_COMMUNITY)
Admission: EM | Admit: 2013-12-01 | Discharge: 2013-12-01 | Disposition: A | Discharge status: Home / Self Care | Payer: No Typology Code available for payment source | Attending: Emergency Medicine | Admitting: Emergency Medicine

## 2013-12-01 DIAGNOSIS — IMO0002 Reserved for concepts with insufficient information to code with codable children: Secondary | ICD-10-CM | POA: Insufficient documentation

## 2013-12-01 DIAGNOSIS — F4310 Post-traumatic stress disorder, unspecified: Secondary | ICD-10-CM | POA: Diagnosis not present

## 2013-12-01 DIAGNOSIS — R51 Headache: Secondary | ICD-10-CM | POA: Diagnosis not present

## 2013-12-01 DIAGNOSIS — F17200 Nicotine dependence, unspecified, uncomplicated: Secondary | ICD-10-CM | POA: Insufficient documentation

## 2013-12-01 DIAGNOSIS — Z7982 Long term (current) use of aspirin: Secondary | ICD-10-CM | POA: Insufficient documentation

## 2013-12-01 DIAGNOSIS — F3130 Bipolar disorder, current episode depressed, mild or moderate severity, unspecified: Secondary | ICD-10-CM | POA: Diagnosis not present

## 2013-12-01 DIAGNOSIS — Z862 Personal history of diseases of the blood and blood-forming organs and certain disorders involving the immune mechanism: Secondary | ICD-10-CM | POA: Insufficient documentation

## 2013-12-01 DIAGNOSIS — M545 Low back pain, unspecified: Secondary | ICD-10-CM | POA: Diagnosis present

## 2013-12-01 DIAGNOSIS — Z79899 Other long term (current) drug therapy: Secondary | ICD-10-CM | POA: Insufficient documentation

## 2013-12-01 DIAGNOSIS — I10 Essential (primary) hypertension: Secondary | ICD-10-CM | POA: Diagnosis not present

## 2013-12-01 DIAGNOSIS — Z880 Allergy status to penicillin: Secondary | ICD-10-CM | POA: Insufficient documentation

## 2013-12-01 DIAGNOSIS — Z8542 Personal history of malignant neoplasm of other parts of uterus: Secondary | ICD-10-CM | POA: Diagnosis not present

## 2013-12-01 DIAGNOSIS — Z8673 Personal history of transient ischemic attack (TIA), and cerebral infarction without residual deficits: Secondary | ICD-10-CM | POA: Insufficient documentation

## 2013-12-01 DIAGNOSIS — Z8639 Personal history of other endocrine, nutritional and metabolic disease: Secondary | ICD-10-CM | POA: Insufficient documentation

## 2013-12-01 MED ORDER — MORPHINE SULFATE 4 MG/ML IJ SOLN
4.0000 mg | Freq: Once | INTRAMUSCULAR | Status: AC
  Administered 2013-12-01: 4 mg via INTRAVENOUS
  Filled 2013-12-01: qty 1

## 2013-12-01 MED ORDER — OXYCODONE-ACETAMINOPHEN 5-325 MG PO TABS: 1.0000 | ORAL_TABLET | Freq: Four times a day (QID) | ORAL | Status: DC | PRN

## 2013-12-01 MED ORDER — OXYCODONE-ACETAMINOPHEN 5-325 MG PO TABS
2.0000 | ORAL_TABLET | Freq: Once | ORAL | Status: AC
  Administered 2013-12-01: 2 via ORAL
  Filled 2013-12-01: qty 2

## 2013-12-01 MED ORDER — CYCLOBENZAPRINE HCL 10 MG PO TABS
5.0000 mg | ORAL_TABLET | Freq: Once | ORAL | Status: AC
  Administered 2013-12-01: 5 mg via ORAL
  Filled 2013-12-01: qty 1

## 2013-12-01 MED ORDER — TIZANIDINE HCL 2 MG PO TABS: 2.0000 mg | ORAL_TABLET | Freq: Three times a day (TID) | ORAL | Status: DC | PRN
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
## 2013-12-01 NOTE — ED Notes (Signed)
Pt to CT

## 2013-12-01 NOTE — ED Notes (Signed)
Pt sleeping soundly on stretcher, family at bedside.  Awaiting CT.

## 2013-12-01 NOTE — ED Notes (Signed)
Hold on d/c at this time per Dr. Mingo Amber.
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
## 2013-12-01 NOTE — Discharge Instructions (Signed)

## 2014-03-04 ENCOUNTER — Other Ambulatory Visit: Payer: Self-pay

## 2014-03-04 DIAGNOSIS — Z1239 Encounter for other screening for malignant neoplasm of breast: Secondary | ICD-10-CM

## 2014-03-11 ENCOUNTER — Ambulatory Visit: Payer: Medicare HMO

## 2014-03-25 ENCOUNTER — Ambulatory Visit
Admission: RE | Admit: 2014-03-25 | Discharge: 2014-03-25 | Disposition: A | Discharge status: Home / Self Care | Payer: Medicare HMO | Source: Ambulatory Visit

## 2014-03-25 ENCOUNTER — Other Ambulatory Visit: Payer: Self-pay

## 2014-03-25 DIAGNOSIS — Z1231 Encounter for screening mammogram for malignant neoplasm of breast: Secondary | ICD-10-CM

## 2014-03-25 DIAGNOSIS — Z1239 Encounter for other screening for malignant neoplasm of breast: Secondary | ICD-10-CM

## 2014-03-29 ENCOUNTER — Other Ambulatory Visit: Payer: Self-pay | Admitting: Internal Medicine

## 2014-03-29 ENCOUNTER — Encounter (HOSPITAL_COMMUNITY): Payer: Self-pay | Admitting: Emergency Medicine

## 2014-03-29 DIAGNOSIS — R928 Other abnormal and inconclusive findings on diagnostic imaging of breast: Secondary | ICD-10-CM

## 2014-04-02 ENCOUNTER — Ambulatory Visit
Admission: RE | Admit: 2014-04-02 | Discharge: 2014-04-02 | Disposition: A | Discharge status: Home / Self Care | Payer: Medicare HMO | Source: Ambulatory Visit | Attending: Internal Medicine | Admitting: Internal Medicine

## 2014-04-02 DIAGNOSIS — R928 Other abnormal and inconclusive findings on diagnostic imaging of breast: Secondary | ICD-10-CM

## 2014-04-05 ENCOUNTER — Other Ambulatory Visit (HOSPITAL_COMMUNITY): Payer: Self-pay | Admitting: Cardiology

## 2014-04-05 DIAGNOSIS — I209 Angina pectoris, unspecified: Secondary | ICD-10-CM

## 2014-04-06 ENCOUNTER — Encounter (HOSPITAL_COMMUNITY): Admission: RE | Payer: Self-pay | Source: Ambulatory Visit

## 2014-04-06 ENCOUNTER — Ambulatory Visit (HOSPITAL_COMMUNITY): Admission: RE | Admit: 2014-04-06 | Payer: Medicare HMO | Source: Ambulatory Visit | Admitting: Cardiology

## 2014-04-06 SURGERY — LEFT HEART CATHETERIZATION WITH CORONARY ANGIOGRAM
Anesthesia: LOCAL

## 2014-04-12 ENCOUNTER — Ambulatory Visit (HOSPITAL_COMMUNITY)
Admission: RE | Admit: 2014-04-12 | Discharge: 2014-04-12 | Disposition: A | Discharge status: Home / Self Care | Payer: Medicare HMO | Source: Ambulatory Visit | Attending: Cardiology | Admitting: Cardiology

## 2014-04-12 DIAGNOSIS — I209 Angina pectoris, unspecified: Secondary | ICD-10-CM | POA: Diagnosis present

## 2014-04-12 MED ORDER — NITROGLYCERIN 0.4 MG SL SUBL
0.4000 mg | SUBLINGUAL_TABLET | SUBLINGUAL | Status: DC | PRN
  Administered 2014-04-12: 0.4 mg via SUBLINGUAL
  Filled 2014-04-12: qty 25

## 2014-04-12 MED ORDER — IOHEXOL 350 MG/ML SOLN
80.0000 mL | Freq: Once | INTRAVENOUS | Status: AC | PRN
  Administered 2014-04-12: 80 mL via INTRAVENOUS

## 2014-04-12 MED ORDER — METOPROLOL TARTRATE 50 MG PO TABS
50.0000 mg | ORAL_TABLET | Freq: Once | ORAL | Status: AC
  Administered 2014-04-12: 50 mg via ORAL
  Filled 2014-04-12: qty 1

## 2014-04-12 MED ORDER — NITROGLYCERIN 0.4 MG SL SUBL
SUBLINGUAL_TABLET | SUBLINGUAL | Status: DC
Start: 2014-04-12 — End: 2014-04-13
  Filled 2014-04-12: qty 1

## 2014-04-12 MED ORDER — METOPROLOL TARTRATE 50 MG PO TABS
ORAL_TABLET | ORAL | Status: AC
  Filled 2014-04-12: qty 1

## 2014-04-15 ENCOUNTER — Ambulatory Visit (HOSPITAL_COMMUNITY): Payer: Medicare HMO

## 2014-04-15 ENCOUNTER — Other Ambulatory Visit: Payer: Medicare HMO

## 2014-09-22 ENCOUNTER — Ambulatory Visit: Payer: Medicare HMO | Attending: Internal Medicine | Admitting: Physical Therapy

## 2014-09-22 DIAGNOSIS — M436 Torticollis: Secondary | ICD-10-CM | POA: Diagnosis not present

## 2014-09-22 DIAGNOSIS — Z8542 Personal history of malignant neoplasm of other parts of uterus: Secondary | ICD-10-CM | POA: Diagnosis not present

## 2014-09-22 DIAGNOSIS — M545 Low back pain: Secondary | ICD-10-CM | POA: Diagnosis not present

## 2014-09-22 DIAGNOSIS — E785 Hyperlipidemia, unspecified: Secondary | ICD-10-CM | POA: Diagnosis not present

## 2014-09-22 DIAGNOSIS — I10 Essential (primary) hypertension: Secondary | ICD-10-CM | POA: Insufficient documentation

## 2014-09-22 DIAGNOSIS — E079 Disorder of thyroid, unspecified: Secondary | ICD-10-CM | POA: Insufficient documentation

## 2014-09-22 DIAGNOSIS — R293 Abnormal posture: Secondary | ICD-10-CM | POA: Diagnosis not present

## 2014-09-22 DIAGNOSIS — Z8673 Personal history of transient ischemic attack (TIA), and cerebral infarction without residual deficits: Secondary | ICD-10-CM | POA: Diagnosis not present

## 2014-09-22 DIAGNOSIS — M62830 Muscle spasm of back: Secondary | ICD-10-CM | POA: Diagnosis not present

## 2014-09-22 DIAGNOSIS — R269 Unspecified abnormalities of gait and mobility: Secondary | ICD-10-CM | POA: Insufficient documentation

## 2014-09-22 DIAGNOSIS — M5386 Other specified dorsopathies, lumbar region: Secondary | ICD-10-CM

## 2014-09-22 NOTE — Patient Instructions (Signed)
    Cohick PT, DPT, LAT, ATC  [facility] Phone: [phone]     
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
## 2014-10-06 ENCOUNTER — Ambulatory Visit: Payer: Medicare HMO | Attending: Internal Medicine | Admitting: Physical Therapy

## 2014-10-06 DIAGNOSIS — Z8542 Personal history of malignant neoplasm of other parts of uterus: Secondary | ICD-10-CM | POA: Diagnosis not present

## 2014-10-06 DIAGNOSIS — Z8673 Personal history of transient ischemic attack (TIA), and cerebral infarction without residual deficits: Secondary | ICD-10-CM | POA: Diagnosis not present

## 2014-10-06 DIAGNOSIS — I10 Essential (primary) hypertension: Secondary | ICD-10-CM | POA: Insufficient documentation

## 2014-10-06 DIAGNOSIS — R269 Unspecified abnormalities of gait and mobility: Secondary | ICD-10-CM | POA: Diagnosis not present

## 2014-10-06 DIAGNOSIS — M5386 Other specified dorsopathies, lumbar region: Secondary | ICD-10-CM

## 2014-10-06 DIAGNOSIS — M436 Torticollis: Secondary | ICD-10-CM | POA: Diagnosis not present

## 2014-10-06 DIAGNOSIS — R293 Abnormal posture: Secondary | ICD-10-CM | POA: Insufficient documentation

## 2014-10-06 DIAGNOSIS — M62830 Muscle spasm of back: Secondary | ICD-10-CM | POA: Diagnosis not present

## 2014-10-06 DIAGNOSIS — M545 Low back pain: Secondary | ICD-10-CM | POA: Insufficient documentation

## 2014-10-06 DIAGNOSIS — E785 Hyperlipidemia, unspecified: Secondary | ICD-10-CM | POA: Diagnosis not present

## 2014-10-06 DIAGNOSIS — E079 Disorder of thyroid, unspecified: Secondary | ICD-10-CM | POA: Insufficient documentation

## 2014-10-06 NOTE — Patient Instructions (Signed)
   Knee to Chest (Flexion)   Pull knee toward chest. Feel stretch in lower back or buttock area. Breathing deeply, Hold __30__ seconds. Repeat with other knee. Repeat _3___ times. Do __2__ sessions per day.    Lumbar Rotation: Caudal - Bilateral (Supine)   Feet and knees together, arms outstretched, rock knees left and right, staying within shoulder distance ( man in picture is going too far) ,relaxing muscles of low back. Perform for 10 seconds. Relax. Repeat _5___ times per set.  Do __2__ sessions per day.   PELVIC STABILIZATION: Pelvic Tilt (Lying)   Exhaling, pull belly toward spine so your back flattens. Release and then lift small of back off the mat.  Repeat __10_ times. Do __2_ times per day.  Copyright  VHI. All rights reserved.

## 2014-10-07 NOTE — Therapy (Signed)
[place], [place], [address] Phone: [phone]   Fax:  [phone]  Physical Therapy Treatment  Patient Details  Name: [name] MRN: [id number] Date of Birth: 10/12/1960 Referring Provider:  Rogers Blocker, MD  Encounter Date: 10/06/2014      PT End of Session - 10/06/14 1515    Visit Number 2   Number of Visits 16   Date for PT Re-Evaluation 11/22/14   PT Start Time 0301   PT Stop Time 0352   PT Time Calculation (min) 51 min      Past Medical History  Diagnosis Date  . Thyroid disease   . Hypertension   . CVA (cerebrovascular accident) 2007  . Cancer     endometrial  . Bipolar 1 disorder   . Depression   . PTSD (post-traumatic stress disorder)     Past Surgical History  Procedure Laterality Date  . Abdominal hysterectomy  07/2009  . Tubal ligation    . Salpingoophorectomy      There were no vitals filed for this visit.  Visit Diagnosis:  Midline low back pain, with sciatica presence unspecified  Muscle spasm of back  Stiffness of cervical spine  Decreased ROM of lumbar spine  Abnormal posture  Abnormality of gait                               PT Education - 10/06/14 1608    Education provided Yes   Education Details single knee to chest, trunk rotation, pelvic tilts.   Person(s) Educated Patient   Methods Explanation;Handout   Comprehension Verbalized understanding          PT Short Term Goals - 09/22/14 1803    PT SHORT TERM GOAL #1   Title Pt will be I with basic HEP 10/22/2014   Time 4   Period Weeks   Status New   PT SHORT TERM GOAL #2   Title pt will decrease pain to <5/10 to assist with exercise progression 10/22/2014   Time 4   Period Weeks   Status New   PT SHORT TERM GOAL #3   Title pt will Incrase trunk mobility in all planes by >10 degrees in all planes to assist wtih safety during driving 10/22/7822   Time 4   Period  Weeks   Status New   PT SHORT TERM GOAL #4   Title will be able to assess bil LE strength to assist with treatment 10/22/2014   Time 4   Period Weeks   Status New   PT SHORT TERM GOAL #5   Title pt will be able to tolerate sitting for > 10 minutes to assist with ADLs 10/22/2014   Time 4   Period Weeks   Status New           PT Long Term Goals - 09/22/14 1807    PT LONG TERM GOAL #1   Title pt will be I with advanced HEP 11/22/2014   Time 8   Period Weeks   Status New   PT LONG TERM GOAL #2   Title pt will increase overall trunk mobilty by > 20 degrees in all planes to assist with safety during driving and ambulation 11/22/2014   Time 8   Period Weeks   Status New   PT LONG TERM GOAL #3   Title pt will demonstrate <3/10 pain during and following sitting/standing  or walking for >30-45 min to assist with funcitonal endurance 11/22/2014   Time 8   Period Weeks   Status New   PT LONG TERM GOAL #4   Title pt will increase her FOTO score to > 49 to assist with functional capacity 11/22/2014   Time 8   Period Weeks   Status New   PT LONG TERM GOAL #5   Title pt will be able to verbalize and demonstrate techniques to reduce risk or reinjury via postural awareness, lifting and carrying mechanics, and HEP 11/22/2014   Time 8   Period Weeks   Status New               Plan - 10/06/14 1611    Clinical Impression Statement Pt reports no improvement with her HEP thus far. She exhibits hypersensitivity to palpation or pressure in her area of pain. She sits with trunk lean to the right to avoid contact to low back. She requires increased time to complete all exercises and demonstrates pain behaviors with all movements. Trial of electrical stimulation with patient being to sensitive atl level 5 after 4 minutes. Continued with moist heat in sitting with patient reporting decreased pain after. Updated HEP given to pt to address flexibility, mobility.   PT Next Visit Plan review hep,  continue HMP, posture education, decrease lateral shift, gentle exercies due to pain sensitivity        Problem List Patient Active Problem List   Diagnosis Date Noted  . Adenocarcinoma of endometrium 11/25/2010  . SMOKER 07/05/2009  . HYPERLIPIDEMIA 07/04/2009  . HYPERTHYROIDISM 07/01/2009  . HYPERCHOLESTEROLEMIA 07/01/2009  . HYPERTENSION 07/01/2009  . GERD 07/01/2009  . PALPITATIONS 07/01/2009  . CHEST PAIN 07/01/2009    Dorene Ar 10/07/2014, 7:41 AM  [facility] [address] [place], [place], [address] Phone: [phone]   Fax:  [phone]

## 2014-10-07 NOTE — Therapy (Signed)
[place], [place], [address] Phone: [phone]   Fax:  [phone]  Physical Therapy Treatment  Patient Details  Name: [name] MRN: [id number] Date of Birth: 03/14/1961 Referring Provider:  Rogers Blocker, MD  Encounter Date: 10/06/2014      PT End of Session - 10/06/14 1515    Visit Number 2   Number of Visits 16   Date for PT Re-Evaluation 11/22/14   PT Start Time 0301   PT Stop Time 0352   PT Time Calculation (min) 51 min      Past Medical History  Diagnosis Date  . Thyroid disease   . Hypertension   . CVA (cerebrovascular accident) 2007  . Cancer     endometrial  . Bipolar 1 disorder   . Depression   . PTSD (post-traumatic stress disorder)     Past Surgical History  Procedure Laterality Date  . Abdominal hysterectomy  07/2009  . Tubal ligation    . Salpingoophorectomy      There were no vitals filed for this visit.  Visit Diagnosis:  Midline low back pain, with sciatica presence unspecified  Muscle spasm of back  Stiffness of cervical spine  Decreased ROM of lumbar spine  Abnormal posture  Abnormality of gait                               PT Education - 10/06/14 1608    Education provided Yes   Education Details single knee to chest, trunk rotation, pelvic tilts.   Person(s) Educated Patient   Methods Explanation;Handout   Comprehension Verbalized understanding          PT Short Term Goals - 09/22/14 1803    PT SHORT TERM GOAL #1   Title Pt will be I with basic HEP 10/22/2014   Time 4   Period Weeks   Status New   PT SHORT TERM GOAL #2   Title pt will decrease pain to <5/10 to assist with exercise progression 10/22/2014   Time 4   Period Weeks   Status New   PT SHORT TERM GOAL #3   Title pt will Incrase trunk mobility in all planes by >10 degrees in all planes to assist wtih safety during driving 1/44/3154   Time 4   Period  Weeks   Status New   PT SHORT TERM GOAL #4   Title will be able to assess bil LE strength to assist with treatment 10/22/2014   Time 4   Period Weeks   Status New   PT SHORT TERM GOAL #5   Title pt will be able to tolerate sitting for > 10 minutes to assist with ADLs 10/22/2014   Time 4   Period Weeks   Status New           PT Long Term Goals - 09/22/14 1807    PT LONG TERM GOAL #1   Title pt will be I with advanced HEP 11/22/2014   Time 8   Period Weeks   Status New   PT LONG TERM GOAL #2   Title pt will increase overall trunk mobilty by > 20 degrees in all planes to assist with safety during driving and ambulation 11/22/2014   Time 8   Period Weeks   Status New   PT LONG TERM GOAL #3   Title pt will demonstrate <3/10 pain during and following sitting/standing  or walking for >30-45 min to assist with funcitonal endurance 11/22/2014   Time 8   Period Weeks   Status New   PT LONG TERM GOAL #4   Title pt will increase her FOTO score to > 49 to assist with functional capacity 11/22/2014   Time 8   Period Weeks   Status New   PT LONG TERM GOAL #5   Title pt will be able to verbalize and demonstrate techniques to reduce risk or reinjury via postural awareness, lifting and carrying mechanics, and HEP 11/22/2014   Time 8   Period Weeks   Status New               Plan - 10/06/14 1611    Clinical Impression Statement Pt reports no improvement with her HEP thus far. She exhibits hypersensitivity to palpation or pressure in her area of pain. She sits with trunk lean to the right to avoid contact to low back. She requires increased time to complete all exercises and demonstrates pain behaviors with all movements. Trial of electrical stimulation with patient being to sensitive atl level 5 after 4 minutes. Continued with moist heat in sitting with patient reporting decreased pain after. Updated HEP given to pt to address flexibility, mobility.   PT Next Visit Plan review hep,  continue HMP, posture education, decrease lateral shift, gentle exercies due to pain sensitivity        Problem List Patient Active Problem List   Diagnosis Date Noted  . Adenocarcinoma of endometrium 11/25/2010  . SMOKER 07/05/2009  . HYPERLIPIDEMIA 07/04/2009  . HYPERTHYROIDISM 07/01/2009  . HYPERCHOLESTEROLEMIA 07/01/2009  . HYPERTENSION 07/01/2009  . GERD 07/01/2009  . PALPITATIONS 07/01/2009  . CHEST PAIN 07/01/2009    Dorene Ar 10/07/2014, 7:41 AM  [facility] [address] [place], [place], [address] Phone: [phone]   Fax:  [phone]

## 2014-10-07 NOTE — Therapy (Signed)
[place], [place], [address] Phone: [phone]   Fax:  [phone]  Physical Therapy Treatment  Patient Details  Name: [name] MRN: [id number] Date of Birth: 25-Apr-1961 Referring Provider:  Rogers Blocker, MD  Encounter Date: 10/06/2014      PT End of Session - 10/06/14 1515    Visit Number 2   Number of Visits 16   Date for PT Re-Evaluation 11/22/14   PT Start Time 0301   PT Stop Time 0352   PT Time Calculation (min) 51 min      Past Medical History  Diagnosis Date  . Thyroid disease   . Hypertension   . CVA (cerebrovascular accident) 2007  . Cancer     endometrial  . Bipolar 1 disorder   . Depression   . PTSD (post-traumatic stress disorder)     Past Surgical History  Procedure Laterality Date  . Abdominal hysterectomy  07/2009  . Tubal ligation    . Salpingoophorectomy      There were no vitals filed for this visit.  Visit Diagnosis:  Midline low back pain, with sciatica presence unspecified  Muscle spasm of back  Stiffness of cervical spine  Decreased ROM of lumbar spine  Abnormal posture  Abnormality of gait                               PT Education - 10/06/14 1608    Education provided Yes   Education Details single knee to chest, trunk rotation, pelvic tilts.   Person(s) Educated Patient   Methods Explanation;Handout   Comprehension Verbalized understanding          PT Short Term Goals - 09/22/14 1803    PT SHORT TERM GOAL #1   Title Pt will be I with basic HEP 10/22/2014   Time 4   Period Weeks   Status New   PT SHORT TERM GOAL #2   Title pt will decrease pain to <5/10 to assist with exercise progression 10/22/2014   Time 4   Period Weeks   Status New   PT SHORT TERM GOAL #3   Title pt will Incrase trunk mobility in all planes by >10 degrees in all planes to assist wtih safety during driving 9/83/3825   Time 4   Period  Weeks   Status New   PT SHORT TERM GOAL #4   Title will be able to assess bil LE strength to assist with treatment 10/22/2014   Time 4   Period Weeks   Status New   PT SHORT TERM GOAL #5   Title pt will be able to tolerate sitting for > 10 minutes to assist with ADLs 10/22/2014   Time 4   Period Weeks   Status New           PT Long Term Goals - 09/22/14 1807    PT LONG TERM GOAL #1   Title pt will be I with advanced HEP 11/22/2014   Time 8   Period Weeks   Status New   PT LONG TERM GOAL #2   Title pt will increase overall trunk mobilty by > 20 degrees in all planes to assist with safety during driving and ambulation 11/22/2014   Time 8   Period Weeks   Status New   PT LONG TERM GOAL #3   Title pt will demonstrate <3/10 pain during and following sitting/standing  or walking for >30-45 min to assist with funcitonal endurance 11/22/2014   Time 8   Period Weeks   Status New   PT LONG TERM GOAL #4   Title pt will increase her FOTO score to > 49 to assist with functional capacity 11/22/2014   Time 8   Period Weeks   Status New   PT LONG TERM GOAL #5   Title pt will be able to verbalize and demonstrate techniques to reduce risk or reinjury via postural awareness, lifting and carrying mechanics, and HEP 11/22/2014   Time 8   Period Weeks   Status New               Plan - 10/06/14 1611    Clinical Impression Statement Pt reports no improvement with her HEP thus far. She exhibits hypersensitivity to palpation or pressure in her area of pain. She sits with trunk lean to the right to avoid contact to low back. She requires increased time to complete all exercises and demonstrates pain behaviors with all movements. Trial of electrical stimulation with patient being to sensitive atl level 5 after 4 minutes. Continued with moist heat in sitting with patient reporting decreased pain after. Updated HEP given to pt to address flexibility, mobility.   PT Next Visit Plan review hep,  continue HMP, posture education, decrease lateral shift, gentle exercies due to pain sensitivity        Problem List Patient Active Problem List   Diagnosis Date Noted  . Adenocarcinoma of endometrium 11/25/2010  . SMOKER 07/05/2009  . HYPERLIPIDEMIA 07/04/2009  . HYPERTHYROIDISM 07/01/2009  . HYPERCHOLESTEROLEMIA 07/01/2009  . HYPERTENSION 07/01/2009  . GERD 07/01/2009  . PALPITATIONS 07/01/2009  . CHEST PAIN 07/01/2009    Dorene Ar 10/07/2014, 7:41 AM  [facility] [address] [place], [place], [address] Phone: [phone]   Fax:  [phone]

## 2014-10-11 ENCOUNTER — Ambulatory Visit: Payer: Medicare HMO | Admitting: Physical Therapy

## 2014-10-13 ENCOUNTER — Ambulatory Visit: Payer: Medicare HMO | Admitting: Physical Therapy

## 2014-10-18 ENCOUNTER — Encounter: Payer: Medicare HMO | Admitting: Physical Therapy

## 2014-10-20 ENCOUNTER — Ambulatory Visit: Payer: Medicare HMO | Admitting: Physical Therapy

## 2014-10-20 DIAGNOSIS — M436 Torticollis: Secondary | ICD-10-CM

## 2014-10-20 DIAGNOSIS — M545 Low back pain: Secondary | ICD-10-CM | POA: Diagnosis not present

## 2014-10-20 DIAGNOSIS — M5386 Other specified dorsopathies, lumbar region: Secondary | ICD-10-CM

## 2014-10-20 DIAGNOSIS — M62830 Muscle spasm of back: Secondary | ICD-10-CM

## 2014-10-20 DIAGNOSIS — R269 Unspecified abnormalities of gait and mobility: Secondary | ICD-10-CM

## 2014-10-20 DIAGNOSIS — R293 Abnormal posture: Secondary | ICD-10-CM

## 2014-10-20 NOTE — Therapy (Signed)
[place], [place], [address] Phone: [phone]   Fax:  [phone]  Physical Therapy Treatment  Patient Details  Name: [name] MRN: [id number] Date of Birth: July 11, 1960 Referring Provider:  Rogers Blocker, MD  Encounter Date: 10/20/2014      PT End of Session - 10/20/14 1545    Visit Number 3   Number of Visits 16   Date for PT Re-Evaluation 11/22/14   PT Start Time 1520   PT Stop Time 1559   PT Time Calculation (min) 39 min   Activity Tolerance Patient tolerated treatment well;Patient limited by pain      Past Medical History  Diagnosis Date  . Thyroid disease   . Hypertension   . CVA (cerebrovascular accident) 2007  . Cancer     endometrial  . Bipolar 1 disorder   . Depression   . PTSD (post-traumatic stress disorder)     Past Surgical History  Procedure Laterality Date  . Abdominal hysterectomy  07/2009  . Tubal ligation    . Salpingoophorectomy      There were no vitals filed for this visit.  Visit Diagnosis:  Midline low back pain, with sciatica presence unspecified  Muscle spasm of back  Stiffness of cervical spine  Decreased ROM of lumbar spine  Abnormal posture  Abnormality of gait      Subjective Assessment - 10/20/14 1609    Subjective reports missing some visits due to a death in the family. She states she has been doing her HEP but that it does make her feel sore.    Currently in Pain? Yes   Pain Score 7    Pain Location Back   Pain Orientation Lower;Mid   Pain Descriptors / Indicators Aching;Sharp   Pain Type Chronic pain   Pain Onset More than a month ago   Pain Frequency Constant   Aggravating Factors  sitting/ standing for long periods of time, driving, laying supine, house hold activities/lifting   Pain Relieving Factors pain medication, heating pad                         [facility] Adult PT Treatment/Exercise - 10/20/14 1610    Modalities   Modalities Electrical Stimulation   Moist Heat Therapy   Number Minutes Moist Heat 10 Minutes   Moist Heat Location Lumbar Spine  while sitting   Electrical Stimulation   Electrical Stimulation Location 10   Electrical Stimulation Action IFC   Electrical Stimulation Parameters 100% scan x 10 min   Electrical Stimulation Goals Pain   Manual Therapy   Other Manual Therapy lateral shift correction  with extension, tightness during correction                PT Education - 10/20/14 1615    Education provided Yes   Education Details looking and leaning to the left to assist with shift correction   Person(s) Educated Patient   Methods Explanation   Comprehension Verbalized understanding          PT Short Term Goals - 10/20/14 1616    PT SHORT TERM GOAL #1   Title Pt will be I with basic HEP 10/22/2014   Time 4   Period Weeks   Status On-going   PT SHORT TERM GOAL #2   Title pt will decrease pain to <5/10 to assist with exercise progression 10/22/2014   Time 4   Period Weeks  Status On-going   PT SHORT TERM GOAL #3   Title pt will Incrase trunk mobility in all planes by >10 degrees in all planes to assist wtih safety during driving 7/82/9562   Time 4   Period Weeks   Status On-going   PT SHORT TERM GOAL #4   Title will be able to assess bil LE strength to assist with treatment 10/22/2014   Time 4   Period Weeks   Status On-going   PT SHORT TERM GOAL #5   Title pt will be able to tolerate sitting for > 10 minutes to assist with ADLs 10/22/2014   Period Weeks   Status On-going           PT Long Term Goals - 10/20/14 1616    PT LONG TERM GOAL #1   Title pt will be I with advanced HEP 11/22/2014   Time 8   Period Weeks   Status On-going   PT LONG TERM GOAL #2   Title pt will increase overall trunk mobilty by > 20 degrees in all planes to assist with safety during driving and ambulation 11/22/2014   Time 8   Period Weeks   Status On-going   PT  LONG TERM GOAL #3   Title pt will demonstrate <3/10 pain during and following sitting/standing or walking for >30-45 min to assist with funcitonal endurance 11/22/2014   Time 8   Period Weeks   Status On-going   PT LONG TERM GOAL #4   Title pt will increase her FOTO score to > 49 to assist with functional capacity 11/22/2014   Time 8   Period Weeks   Status On-going   PT LONG TERM GOAL #5   Title pt will be able to verbalize and demonstrate techniques to reduce risk or reinjury via postural awareness, lifting and carrying mechanics, and HEP 11/22/2014   Time 8   Period Weeks   Status On-going               Plan - 10/20/14 1612    Clinical Impression Statement [name] reports to therapy today 20 minutes late. she reports no significant relief but has been trying to continue with her HEP. due to pt running late focused todays treatment on manual treatment focusing on shift correction and with repeated extension. after performing 10-20  she reported tightness in the L low back.  E-stim with heat helped decreaed pain and paraspinal tightness and repeated manual shift correction which she reported significant releif and left therapy standing more straight than when she came in. Educated about continuing with HEP and continue to work on moving to the L to help correct the R lateral shift.     PT Next Visit Plan review hep, continue HMP, posture education, decrease lateral shift, gentle exercies due to pain sensitivity   PT Home Exercise Plan educated on looking and leaning to the left to assist with shift correction    Consulted and Agree with Plan of Care Patient        Problem List Patient Active Problem List   Diagnosis Date Noted  . Adenocarcinoma of endometrium 11/25/2010  . SMOKER 07/05/2009  . HYPERLIPIDEMIA 07/04/2009  . HYPERTHYROIDISM 07/01/2009  . HYPERCHOLESTEROLEMIA 07/01/2009  . HYPERTENSION 07/01/2009  . GERD 07/01/2009  . PALPITATIONS 07/01/2009  . CHEST PAIN  07/01/2009   [name] PT, DPT, LAT, ATC  10/20/2014  4:19 PM   [facility] [address] [place], [place], [address]  Phone: [phone]   Fax:  [phone]

## 2014-11-03 ENCOUNTER — Telehealth: Payer: Self-pay | Admitting: Physical Therapy

## 2014-11-03 ENCOUNTER — Ambulatory Visit: Payer: Medicare HMO | Attending: Internal Medicine | Admitting: Physical Therapy

## 2014-11-03 DIAGNOSIS — M2560 Stiffness of unspecified joint, not elsewhere classified: Secondary | ICD-10-CM | POA: Insufficient documentation

## 2014-11-03 DIAGNOSIS — R293 Abnormal posture: Secondary | ICD-10-CM | POA: Insufficient documentation

## 2014-11-03 DIAGNOSIS — M545 Low back pain: Secondary | ICD-10-CM | POA: Insufficient documentation

## 2014-11-03 DIAGNOSIS — M436 Torticollis: Secondary | ICD-10-CM | POA: Insufficient documentation

## 2014-11-03 DIAGNOSIS — R269 Unspecified abnormalities of gait and mobility: Secondary | ICD-10-CM | POA: Insufficient documentation

## 2014-11-03 DIAGNOSIS — M62830 Muscle spasm of back: Secondary | ICD-10-CM | POA: Insufficient documentation

## 2014-11-03 NOTE — Telephone Encounter (Signed)
Left message on patient's voicemail regarding missed appointment today at 2:15pm. Asked for patient to call and cancel if her future appointments are not needed.

## 2014-11-08 ENCOUNTER — Ambulatory Visit: Payer: Medicare HMO | Admitting: Physical Therapy

## 2014-11-08 DIAGNOSIS — M545 Low back pain: Secondary | ICD-10-CM

## 2014-11-08 DIAGNOSIS — R293 Abnormal posture: Secondary | ICD-10-CM

## 2014-11-08 DIAGNOSIS — R269 Unspecified abnormalities of gait and mobility: Secondary | ICD-10-CM | POA: Diagnosis present

## 2014-11-08 DIAGNOSIS — M436 Torticollis: Secondary | ICD-10-CM

## 2014-11-08 DIAGNOSIS — M5386 Other specified dorsopathies, lumbar region: Secondary | ICD-10-CM

## 2014-11-08 DIAGNOSIS — M2560 Stiffness of unspecified joint, not elsewhere classified: Secondary | ICD-10-CM | POA: Diagnosis present

## 2014-11-08 DIAGNOSIS — M62830 Muscle spasm of back: Secondary | ICD-10-CM | POA: Diagnosis not present

## 2014-11-08 NOTE — Therapy (Signed)
[place], [place], [address] Phone: [phone]   Fax:  [phone]  Physical Therapy Treatment  Patient Details  Name: [name] MRN: [id number] Date of Birth: 23-Sep-1960 Referring Provider:  Rogers Blocker, MD  Encounter Date: 11/08/2014      PT End of Session - 11/08/14 1234    Visit Number 4   Number of Visits 16   Date for PT Re-Evaluation 11/22/14   PT Start Time 6759   PT Stop Time 1230   PT Time Calculation (min) 45 min   Activity Tolerance Patient tolerated treatment well   Behavior During Therapy [facility] for tasks assessed/performed      Past Medical History  Diagnosis Date  . Thyroid disease   . Hypertension   . CVA (cerebrovascular accident) 2007  . Cancer     endometrial  . Bipolar 1 disorder   . Depression   . PTSD (post-traumatic stress disorder)     Past Surgical History  Procedure Laterality Date  . Abdominal hysterectomy  07/2009  . Tubal ligation    . Salpingoophorectomy      There were no vitals filed for this visit.  Visit Diagnosis:  Midline low back pain, with sciatica presence unspecified  Muscle spasm of back  Stiffness of cervical spine  Decreased ROM of lumbar spine  Abnormal posture  Abnormality of gait      Subjective Assessment - 11/08/14 1156    Subjective "I still have my good and bad days but I can tell I am getting a little better and that I can move more"   Currently in Pain? Yes   Pain Score 7    Pain Location Back   Pain Orientation Lower;Mid   Pain Descriptors / Indicators Aching;Sharp   Pain Type Chronic pain   Pain Onset More than a month ago   Pain Frequency Constant   Aggravating Factors  sitting/standing walking for long periods of time, driving, laying down.    Pain Relieving Factors pain medicaiton, heating pad                         [facility] Adult PT Treatment/Exercise - 11/08/14 0001    Lumbar Exercises:  Standing   Other Standing Lumbar Exercises functional rotation in standing 2 x 10    Moist Heat Therapy   Number Minutes Moist Heat 10 Minutes   Moist Heat Location Lumbar Spine  in sitting   Electrical Stimulation   Electrical Stimulation Location low back   Electrical Stimulation Action IFC   Electrical Stimulation Parameters 10 min, 100% scan to tolerance   Electrical Stimulation Goals Pain   Manual Therapy   Manual Therapy Soft tissue mobilization   Soft tissue mobilization STM/ of bil paraspinals with increased focus on the R   Other Manual Therapy lateral shift correction                PT Education - 11/08/14 1233    Education provided Yes   Education Details re-educated about leaning to the left while sitting/walking and laying on left with pillow underside to help stretch R paraspinal    Person(s) Educated Patient   Methods Explanation   Comprehension Verbalized understanding          PT Short Term Goals - 11/08/14 1237    PT SHORT TERM GOAL #1   Title Pt will be I with basic HEP 10/22/2014   Time 4  Period Weeks   Status On-going   PT SHORT TERM GOAL #2   Title pt will decrease pain to <5/10 to assist with exercise progression 10/22/2014   Time 4   Period Weeks   Status On-going   PT SHORT TERM GOAL #3   Title pt will Incrase trunk mobility in all planes by >10 degrees in all planes to assist wtih safety during driving 1/49/7026   Time 4   Period Weeks   Status On-going   PT SHORT TERM GOAL #4   Title will be able to assess bil LE strength to assist with treatment 10/22/2014   Time 4   Period Weeks   Status On-going   PT SHORT TERM GOAL #5   Title pt will be able to tolerate sitting for > 10 minutes to assist with ADLs 10/22/2014   Time 4   Period Weeks   Status On-going           PT Long Term Goals - 11/08/14 1237    PT LONG TERM GOAL #1   Title pt will be I with advanced HEP 11/22/2014   Time 8   Period Weeks   Status On-going   PT  LONG TERM GOAL #2   Title pt will increase overall trunk mobilty by > 20 degrees in all planes to assist with safety during driving and ambulation 11/22/2014   Time 8   Period Weeks   Status On-going   PT LONG TERM GOAL #3   Title pt will demonstrate <3/10 pain during and following sitting/standing or walking for >30-45 min to assist with funcitonal endurance 11/22/2014   Time 8   Period Weeks   Status On-going   PT LONG TERM GOAL #4   Title pt will increase her FOTO score to > 49 to assist with functional capacity 11/22/2014   Time 8   Period Weeks   Status On-going   PT LONG TERM GOAL #5   Title pt will be able to verbalize and demonstrate techniques to reduce risk or reinjury via postural awareness, lifting and carrying mechanics, and HEP 11/22/2014   Time 8   Period Weeks   Status On-going               Plan - 11/08/14 1234    Clinical Impression Statement [name] continues to demonstrate fear of movement with a R leaning posture during standing/ sitting. utilized heat and stim to decreased pain and incresaed paraspinal elasticity. She reports being able to do more and is getting better slowly but continues.  performed STM in left sidelying with pillow under side to help elongate R parapsinals. Plan to progress with activity as tolerated.    PT Next Visit Plan review hep, continue HMP, posture education, decrease lateral shift, gentle exercies due to pain sensitivity   PT Home Exercise Plan re-educated about leaning to the left while sitting/walking and laying on left with pillow underside to help stretch R paraspinal    Consulted and Agree with Plan of Care Patient        Problem List Patient Active Problem List   Diagnosis Date Noted  . Adenocarcinoma of endometrium 11/25/2010  . SMOKER 07/05/2009  . HYPERLIPIDEMIA 07/04/2009  . HYPERTHYROIDISM 07/01/2009  . HYPERCHOLESTEROLEMIA 07/01/2009  . HYPERTENSION 07/01/2009  . GERD 07/01/2009  . PALPITATIONS 07/01/2009  .  CHEST PAIN 07/01/2009   [name] PT, DPT, LAT, ATC  11/08/2014  12:39 PM      [facility]  [place], [place], [address] Phone: [phone]   Fax:  [phone]

## 2014-11-10 ENCOUNTER — Ambulatory Visit: Payer: Medicare HMO | Admitting: Physical Therapy

## 2014-11-10 DIAGNOSIS — M5386 Other specified dorsopathies, lumbar region: Secondary | ICD-10-CM

## 2014-11-10 DIAGNOSIS — M545 Low back pain: Secondary | ICD-10-CM

## 2014-11-10 DIAGNOSIS — M62830 Muscle spasm of back: Secondary | ICD-10-CM

## 2014-11-10 DIAGNOSIS — R269 Unspecified abnormalities of gait and mobility: Secondary | ICD-10-CM

## 2014-11-10 DIAGNOSIS — M436 Torticollis: Secondary | ICD-10-CM

## 2014-11-10 DIAGNOSIS — R293 Abnormal posture: Secondary | ICD-10-CM

## 2014-11-10 NOTE — Therapy (Addendum)
[place], [place], [address] Phone: [phone]   Fax:  [phone]  Physical Therapy Treatment  Patient Details  Name: [name] MRN: [id number] Date of Birth: September 16, 1960 Referring Provider:  Rogers Blocker, MD  Encounter Date: 11/10/2014      PT End of Session - 11/10/14 1104    Visit Number 5   Number of Visits 16   Date for PT Re-Evaluation 11/22/14   PT Start Time 1102   PT Stop Time 1145   PT Time Calculation (min) 43 min      Past Medical History  Diagnosis Date  . Thyroid disease   . Hypertension   . CVA (cerebrovascular accident) 2007  . Cancer     endometrial  . Bipolar 1 disorder   . Depression   . PTSD (post-traumatic stress disorder)     Past Surgical History  Procedure Laterality Date  . Abdominal hysterectomy  07/2009  . Tubal ligation    . Salpingoophorectomy      There were no vitals filed for this visit.  Visit Diagnosis:  Muscle spasm of back  Midline low back pain, with sciatica presence unspecified  Stiffness of cervical spine  Decreased ROM of lumbar spine  Abnormal posture  Abnormality of gait      Subjective Assessment - 11/10/14 1103    Currently in Pain? Yes   Pain Score 5    Pain Location Back   Pain Orientation Mid;Lower                         [facility] Adult PT Treatment/Exercise - 11/10/14 1118    Lumbar Exercises: Aerobic   Stationary Bike Nustep L 1 x 5 min  UE/LE   Lumbar Exercises: Seated   Other Seated Lumbar Exercises Seated on Sit-fit: pelvic tilting anterior and posterior x 10 with mod cues reqd, right to left pelvic shifting x 10, circles x 10 each way incorporating all directions.    Modalities   Modalities Electrical Stimulation   Moist Heat Therapy   Number Minutes Moist Heat 15 Minutes   Moist Heat Location Lumbar Spine  sidelying   Electrical Stimulation   Electrical Stimulation Location low back   Electrical Stimulation Action IFC   Electrical Stimulation Parameters 10   Electrical Stimulation Goals Pain                  PT Short Term Goals - 11/08/14 1237    PT SHORT TERM GOAL #1   Title Pt will be I with basic HEP 10/22/2014   Time 4   Period Weeks   Status On-going   PT SHORT TERM GOAL #2   Title pt will decrease pain to <5/10 to assist with exercise progression 10/22/2014   Time 4   Period Weeks   Status On-going   PT SHORT TERM GOAL #3   Title pt will Incrase trunk mobility in all planes by >10 degrees in all planes to assist wtih safety during driving 7/89/3810   Time 4   Period Weeks   Status On-going   PT SHORT TERM GOAL #4   Title will be able to assess bil LE strength to assist with treatment 10/22/2014   Time 4   Period Weeks   Status On-going   PT SHORT TERM GOAL #5   Title pt will be able to tolerate sitting for > 10 minutes to assist with ADLs 10/22/2014  Time 4   Period Weeks   Status On-going           PT Long Term Goals - 11/08/14 1237    PT LONG TERM GOAL #1   Title pt will be I with advanced HEP 11/22/2014   Time 8   Period Weeks   Status On-going   PT LONG TERM GOAL #2   Title pt will increase overall trunk mobilty by > 20 degrees in all planes to assist with safety during driving and ambulation 11/22/2014   Time 8   Period Weeks   Status On-going   PT LONG TERM GOAL #3   Title pt will demonstrate <3/10 pain during and following sitting/standing or walking for >30-45 min to assist with funcitonal endurance 11/22/2014   Time 8   Period Weeks   Status On-going   PT LONG TERM GOAL #4   Title pt will increase her FOTO score to > 49 to assist with functional capacity 11/22/2014   Time 8   Period Weeks   Status On-going   PT LONG TERM GOAL #5   Title pt will be able to verbalize and demonstrate techniques to reduce risk or reinjury via postural awareness, lifting and carrying mechanics, and HEP 11/22/2014   Time 8   Period Weeks    Status On-going               Plan - 11/10/14 1134    Clinical Impression Statement Pt presents with less pain than last visit. She is instructed in Aerobic exercise using Nustep wtih verbal cues for posture. She is able to tolerate 5 minutes with minimal c/o increased pain. She prefers to sit or lie on her side for exercise so instructed pt in sit-fit exercises with good technique. Added to HEP while seated in chair to encourge pekvic mobility.    PT Next Visit Plan review hep, pt did well with seated sit-fit exercises,continue HMP, posture education, decrease lateral shift, gentle exercies due to pain sensitivity        Problem List Patient Active Problem List   Diagnosis Date Noted  . Adenocarcinoma of endometrium 11/25/2010  . SMOKER 07/05/2009  . HYPERLIPIDEMIA 07/04/2009  . HYPERTHYROIDISM 07/01/2009  . HYPERCHOLESTEROLEMIA 07/01/2009  . HYPERTENSION 07/01/2009  . GERD 07/01/2009  . PALPITATIONS 07/01/2009  . CHEST PAIN 07/01/2009    Dorene Ar, PTA 11/10/2014, 11:41 AM  [facility] [address] [place], [place], [address] Phone: [phone]   Fax:  [phone]                  PHYSICAL THERAPY DISCHARGE SUMMARY  Visits from Start of Care: 5  Current functional level related to goals / functional outcomes: FOTO 71% limited   Remaining deficits: See goals   Education / Equipment: HEP handout  Plan:                                                    Patient goals were not met. Patient is being discharged due to not returning since the last visit.  ?????  Attempted to contact pt multiple times leaving messages and no reply. Will discharge from PT today.

## 2014-11-16 ENCOUNTER — Ambulatory Visit: Payer: Medicare HMO | Admitting: Physical Therapy

## 2014-11-18 ENCOUNTER — Encounter: Payer: Medicare HMO | Admitting: Physical Therapy

## 2014-11-19 ENCOUNTER — Telehealth: Payer: Self-pay | Admitting: Physical Therapy

## 2014-11-19 NOTE — Telephone Encounter (Signed)
Let her know that she missed 2 appointments this week, and to remind her she had an appointment on Monday 11/22/2014.

## 2014-11-22 ENCOUNTER — Ambulatory Visit: Payer: Medicare HMO | Admitting: Physical Therapy

## 2014-11-24 ENCOUNTER — Ambulatory Visit: Payer: Medicare HMO | Admitting: Physical Therapy

## 2014-12-01 ENCOUNTER — Telehealth: Payer: Self-pay | Admitting: Physical Therapy

## 2014-12-01 NOTE — Telephone Encounter (Signed)
Left message regarding the last missed visit and no more visits scheduled. If need to continue with therapy to make appointments or if not will discharge from PT.

## 2015-03-30 ENCOUNTER — Other Ambulatory Visit: Payer: Self-pay

## 2015-03-30 DIAGNOSIS — Z1231 Encounter for screening mammogram for malignant neoplasm of breast: Secondary | ICD-10-CM

## 2015-05-04 ENCOUNTER — Ambulatory Visit
Admission: RE | Admit: 2015-05-04 | Discharge: 2015-05-04 | Disposition: A | Discharge status: Home / Self Care | Payer: Medicare HMO | Source: Ambulatory Visit

## 2015-05-04 DIAGNOSIS — Z1231 Encounter for screening mammogram for malignant neoplasm of breast: Secondary | ICD-10-CM

## 2015-07-21 ENCOUNTER — Ambulatory Visit: Payer: Medicare HMO | Admitting: Obstetrics & Gynecology

## 2015-10-07 ENCOUNTER — Encounter (HOSPITAL_COMMUNITY): Payer: Self-pay | Admitting: *Deleted

## 2015-10-07 ENCOUNTER — Emergency Department (HOSPITAL_COMMUNITY)
Admission: EM | Admit: 2015-10-07 | Discharge: 2015-10-08 | Disposition: A | Discharge status: Home / Self Care | Payer: Medicare HMO | Attending: Emergency Medicine | Admitting: Emergency Medicine

## 2015-10-07 DIAGNOSIS — S29012A Strain of muscle and tendon of back wall of thorax, initial encounter: Secondary | ICD-10-CM | POA: Insufficient documentation

## 2015-10-07 DIAGNOSIS — Y9389 Activity, other specified: Secondary | ICD-10-CM | POA: Insufficient documentation

## 2015-10-07 DIAGNOSIS — S161XXA Strain of muscle, fascia and tendon at neck level, initial encounter: Secondary | ICD-10-CM

## 2015-10-07 DIAGNOSIS — F4310 Post-traumatic stress disorder, unspecified: Secondary | ICD-10-CM | POA: Diagnosis not present

## 2015-10-07 DIAGNOSIS — S39012A Strain of muscle, fascia and tendon of lower back, initial encounter: Secondary | ICD-10-CM | POA: Insufficient documentation

## 2015-10-07 DIAGNOSIS — Z7951 Long term (current) use of inhaled steroids: Secondary | ICD-10-CM | POA: Diagnosis not present

## 2015-10-07 DIAGNOSIS — S40012A Contusion of left shoulder, initial encounter: Secondary | ICD-10-CM | POA: Insufficient documentation

## 2015-10-07 DIAGNOSIS — Z8673 Personal history of transient ischemic attack (TIA), and cerebral infarction without residual deficits: Secondary | ICD-10-CM | POA: Insufficient documentation

## 2015-10-07 DIAGNOSIS — I10 Essential (primary) hypertension: Secondary | ICD-10-CM | POA: Diagnosis not present

## 2015-10-07 DIAGNOSIS — Z91040 Latex allergy status: Secondary | ICD-10-CM | POA: Insufficient documentation

## 2015-10-07 DIAGNOSIS — Z880 Allergy status to penicillin: Secondary | ICD-10-CM | POA: Insufficient documentation

## 2015-10-07 DIAGNOSIS — Z8542 Personal history of malignant neoplasm of other parts of uterus: Secondary | ICD-10-CM | POA: Diagnosis not present

## 2015-10-07 DIAGNOSIS — Z79899 Other long term (current) drug therapy: Secondary | ICD-10-CM | POA: Insufficient documentation

## 2015-10-07 DIAGNOSIS — Z8639 Personal history of other endocrine, nutritional and metabolic disease: Secondary | ICD-10-CM | POA: Insufficient documentation

## 2015-10-07 DIAGNOSIS — Z791 Long term (current) use of non-steroidal anti-inflammatories (NSAID): Secondary | ICD-10-CM | POA: Diagnosis not present

## 2015-10-07 DIAGNOSIS — F319 Bipolar disorder, unspecified: Secondary | ICD-10-CM | POA: Diagnosis not present

## 2015-10-07 DIAGNOSIS — Z7982 Long term (current) use of aspirin: Secondary | ICD-10-CM | POA: Insufficient documentation

## 2015-10-07 DIAGNOSIS — Y92410 Unspecified street and highway as the place of occurrence of the external cause: Secondary | ICD-10-CM | POA: Diagnosis not present

## 2015-10-07 DIAGNOSIS — Y998 Other external cause status: Secondary | ICD-10-CM | POA: Diagnosis not present

## 2015-10-07 DIAGNOSIS — S29019A Strain of muscle and tendon of unspecified wall of thorax, initial encounter: Secondary | ICD-10-CM

## 2015-10-07 DIAGNOSIS — S199XXA Unspecified injury of neck, initial encounter: Secondary | ICD-10-CM | POA: Diagnosis present

## 2015-10-07 MED ORDER — OXYCODONE-ACETAMINOPHEN 5-325 MG PO TABS
1.0000 | ORAL_TABLET | Freq: Once | ORAL | Status: AC
  Administered 2015-10-08: 1 via ORAL
  Filled 2015-10-07: qty 1
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
## 2015-10-08 ENCOUNTER — Emergency Department (HOSPITAL_COMMUNITY): Payer: Medicare HMO

## 2015-10-08 DIAGNOSIS — S161XXA Strain of muscle, fascia and tendon at neck level, initial encounter: Secondary | ICD-10-CM | POA: Diagnosis not present

## 2015-10-08 MED ORDER — OXYCODONE-ACETAMINOPHEN 5-325 MG PO TABS: 1.0000 | ORAL_TABLET | ORAL | Status: AC | PRN

## 2015-10-08 MED ORDER — MELOXICAM 7.5 MG PO TABS
7.5000 mg | ORAL_TABLET | Freq: Every day | ORAL | Status: AC
Start: 2015-10-08 — End: ?

## 2015-10-08 NOTE — Discharge Instructions (Signed)
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 not stand or sit up quickly, especially if you are an older patient. This reduces the risk of dizzy or fainting spells. Alcohol may interfere with the effect of this medicine. Avoid alcoholic drinks. There are different types of narcotic medicines (opiates) for pain. If you take more than one type at the same time, you may have more side effects. Give your health care provider a list of all medicines you use. Your doctor will tell you how much medicine to take. Do  not take more medicine than directed. Call emergency for help if you have problems breathing. The medicine will cause constipation. Try to have a bowel movement at least every 2 to 3 days. If you do not have a bowel movement for 3 days, call your doctor or health care professional. Do not take Tylenol (acetaminophen) or medicines that have acetaminophen with this medicine. Too much acetaminophen can be  very dangerous. Many nonprescription medicines contain acetaminophen. Always read the labels carefully to avoid taking more acetaminophen. What side effects may I notice from receiving this medicine? Side effects that you should report to your doctor or health care professional as soon as possible: -allergic reactions like skin rash, itching or hives, swelling of the face, lips, or tongue -breathing difficulties, wheezing -confusion -light headedness or fainting spells -severe stomach pain -unusually weak or tired -yellowing of the skin or the whites of the eyes Side effects that usually do not require medical attention (report to your doctor or health care professional if they continue or are bothersome): -dizziness -drowsiness -nausea -vomiting This list may not describe all possible side effects. Call your doctor for medical advice about side effects. You may report side effects to FDA at 1-800-FDA-1088. Where should I keep my medicine? Keep out of the reach of children. This medicine can be abused. Keep your medicine in a safe place to protect it from theft. Do not share this medicine with anyone. Selling or giving away this medicine is dangerous and against the law. This medicine may cause accidental overdose and death if it taken by other adults, children, or pets. Mix any unused medicine with a substance like cat litter or coffee grounds. Then throw the medicine away in a sealed container like a sealed bag or a coffee can with a lid. Do not use the medicine after the expiration date. Store at room temperature between 20 and 25 degrees C (68 and 77 degrees F). NOTE: This sheet is a summary. It may not cover all possible information. If you have questions about this medicine, talk to your doctor, pharmacist, or health care provider.    2016, Elsevier/Gold Standard. (2014-04-14 15:18:46)

## 2016-02-20 ENCOUNTER — Encounter: Payer: Self-pay | Admitting: Gastroenterology

## 2016-04-11 ENCOUNTER — Encounter: Payer: Self-pay | Admitting: Internal Medicine

## 2016-04-24 ENCOUNTER — Encounter: Payer: Medicare HMO | Admitting: Gastroenterology

## 2016-04-27 ENCOUNTER — Emergency Department (HOSPITAL_COMMUNITY): Payer: Medicare HMO

## 2016-04-27 ENCOUNTER — Encounter (HOSPITAL_COMMUNITY): Payer: Self-pay | Admitting: Emergency Medicine

## 2016-04-27 ENCOUNTER — Emergency Department (HOSPITAL_COMMUNITY)
Admission: EM | Admit: 2016-04-27 | Discharge: 2016-04-27 | Disposition: A | Discharge status: Home / Self Care | Payer: Medicare HMO | Attending: Emergency Medicine | Admitting: Emergency Medicine

## 2016-04-27 DIAGNOSIS — R072 Precordial pain: Secondary | ICD-10-CM | POA: Diagnosis not present

## 2016-04-27 DIAGNOSIS — Z8673 Personal history of transient ischemic attack (TIA), and cerebral infarction without residual deficits: Secondary | ICD-10-CM | POA: Diagnosis not present

## 2016-04-27 DIAGNOSIS — Z91040 Latex allergy status: Secondary | ICD-10-CM | POA: Diagnosis not present

## 2016-04-27 DIAGNOSIS — R079 Chest pain, unspecified: Secondary | ICD-10-CM

## 2016-04-27 DIAGNOSIS — R05 Cough: Secondary | ICD-10-CM | POA: Diagnosis present

## 2016-04-27 DIAGNOSIS — Z7982 Long term (current) use of aspirin: Secondary | ICD-10-CM | POA: Insufficient documentation

## 2016-04-27 DIAGNOSIS — J40 Bronchitis, not specified as acute or chronic: Secondary | ICD-10-CM | POA: Insufficient documentation

## 2016-04-27 DIAGNOSIS — I10 Essential (primary) hypertension: Secondary | ICD-10-CM | POA: Diagnosis not present

## 2016-04-27 LAB — CBC
HEMATOCRIT: 41.5 % (ref 36.0–46.0)
Hemoglobin: 13.8 g/dL (ref 12.0–15.0)
MCH: 26.3 pg (ref 26.0–34.0)
MCHC: 33.3 g/dL (ref 30.0–36.0)
MCV: 79.2 fL (ref 78.0–100.0)
Platelets: 188 10*3/uL (ref 150–400)
RBC: 5.24 MIL/uL — ABNORMAL HIGH (ref 3.87–5.11)
RDW: 16.5 % — AB (ref 11.5–15.5)
WBC: 11.2 10*3/uL — ABNORMAL HIGH (ref 4.0–10.5)

## 2016-04-27 LAB — BASIC METABOLIC PANEL
Anion gap: 11 (ref 5–15)
BUN: 12 mg/dL (ref 6–20)
CHLORIDE: 105 mmol/L (ref 101–111)
CO2: 22 mmol/L (ref 22–32)
Calcium: 9.3 mg/dL (ref 8.9–10.3)
Creatinine, Ser: 0.91 mg/dL (ref 0.44–1.00)
GFR calc Af Amer: >60 mL/min (ref >60)
GFR calc non Af Amer: >60 mL/min (ref >60)
GLUCOSE: 91 mg/dL (ref 65–99)
POTASSIUM: 3.3 mmol/L — AB (ref 3.5–5.1)
Sodium: 138 mmol/L (ref 135–145)

## 2016-04-27 LAB — I-STAT TROPONIN, ED
Troponin i, poc: 0 ng/mL (ref 0.00–0.08)
Troponin i, poc: 0 ng/mL (ref 0.00–0.08)

## 2016-04-27 MED ORDER — AZITHROMYCIN 250 MG PO TABS: 250.0000 mg | ORAL_TABLET | Freq: Every day | ORAL | 0 refills | Status: AC

## 2016-04-27 MED ORDER — PREDNISONE 20 MG PO TABS
60.0000 mg | ORAL_TABLET | Freq: Once | ORAL | Status: AC
  Administered 2016-04-27: 60 mg via ORAL
  Filled 2016-04-27: qty 3

## 2016-04-27 MED ORDER — IPRATROPIUM-ALBUTEROL 0.5-2.5 (3) MG/3ML IN SOLN
3.0000 mL | Freq: Once | RESPIRATORY_TRACT | Status: AC
  Administered 2016-04-27: 3 mL via RESPIRATORY_TRACT
  Filled 2016-04-27: qty 3

## 2016-04-27 MED ORDER — AZITHROMYCIN 250 MG PO TABS
500.0000 mg | ORAL_TABLET | Freq: Once | ORAL | Status: AC
  Administered 2016-04-27: 500 mg via ORAL
  Filled 2016-04-27: qty 2

## 2016-04-27 MED ORDER — ALBUTEROL SULFATE HFA 108 (90 BASE) MCG/ACT IN AERS
INHALATION_SPRAY | RESPIRATORY_TRACT | Status: AC
  Filled 2016-04-27: qty 6.7

## 2016-04-27 MED ORDER — ALBUTEROL SULFATE HFA 108 (90 BASE) MCG/ACT IN AERS
2.0000 | INHALATION_SPRAY | Freq: Once | RESPIRATORY_TRACT | Status: AC
  Administered 2016-04-27: 2 via RESPIRATORY_TRACT

## 2016-04-27 MED ORDER — PREDNISONE 10 MG PO TABS: 60.0000 mg | ORAL_TABLET | Freq: Every day | ORAL | 0 refills | Status: AC

## 2016-04-27 NOTE — ED Triage Notes (Signed)
Patient states she has a productive cough, sore throat, weakness, and tightness in chest associated with cough. Denies N/V but some diarrhea.

## 2016-04-27 NOTE — ED Provider Notes (Signed)
[facility] DEPT Provider Note   CSN: [id number] Arrival date & time: 04/27/16  1720     History   Chief Complaint Chief Complaint  Patient presents with  . Cough  . Sore Throat  . Weakness  . Chest Pain    HPI [name] is a 55 y.o. female.  The history is provided by the patient.  Cough  This is a new problem. Episode onset: 3 days. The problem occurs every few minutes. The problem has not changed since onset.The cough is productive of blood-tinged sputum and productive of purulent sputum. There has been no fever. Associated symptoms include chest pain, chills, sweats, rhinorrhea, sore throat, myalgias and shortness of breath. She has tried decongestants for the symptoms. The treatment provided mild relief. Risk factors: Son had similar symptoms last week. She is a smoker. Her past medical history is significant for bronchitis and pneumonia.  Sore Throat  Associated symptoms include chest pain and shortness of breath.  Weakness  Associated symptoms include shortness of breath and chest pain. Pertinent negatives include no vomiting.  Chest Pain   This is a recurrent problem. Episode onset: 3 days. The problem occurs constantly. The problem has not changed since onset.The pain is present in the substernal region. The pain is moderate. The pain does not radiate. Associated symptoms include back pain, cough, shortness of breath and weakness. Pertinent negatives include no lower extremity edema, no nausea and no vomiting. Risk factors include smoking/tobacco exposure.  Her past medical history is significant for hypertension and strokes.  Pertinent negatives for past medical history include no hyperlipidemia.  Procedure history is positive for cardiac catheterization (has CAD but declined stenting).    Past Medical History:  Diagnosis Date  . Bipolar 1 disorder ([place])   . Cancer [facility])    endometrial  . CVA (cerebrovascular accident) ([place]) 2007  . Depression   .  Hypertension   . PTSD (post-traumatic stress disorder)   . Thyroid disease     Patient Active Problem List   Diagnosis Date Noted  . Adenocarcinoma of endometrium ([place]) 11/25/2010  . SMOKER 07/05/2009  . HYPERLIPIDEMIA 07/04/2009  . HYPERTHYROIDISM 07/01/2009  . HYPERCHOLESTEROLEMIA 07/01/2009  . HYPERTENSION 07/01/2009  . GERD 07/01/2009  . PALPITATIONS 07/01/2009  . CHEST PAIN 07/01/2009    Past Surgical History:  Procedure Laterality Date  . ABDOMINAL HYSTERECTOMY  07/2009  . SALPINGOOPHORECTOMY    . TUBAL LIGATION      OB History    Gravida Para Term Preterm AB Living   2 2 1     2    SAB TAB Ectopic Multiple Live Births                   Home Medications    Prior to Admission medications   Medication Sig Start Date End Date Taking? Authorizing Provider  aspirin 325 MG tablet Take 325 mg by mouth every morning.    Yes Historical Provider, MD  busPIRone (BUSPAR) 5 MG tablet Take 5 mg by mouth 2 (two) times daily.   Yes Historical Provider, MD  DULoxetine (CYMBALTA) 60 MG capsule Take 60 mg by mouth every morning.    Yes Historical Provider, MD  ferrous gluconate (FERGON) 324 MG tablet Take 324 mg by mouth daily with breakfast.   Yes Historical Provider, MD  gabapentin (NEURONTIN) 600 MG tablet Take 600 mg by mouth 3 (three) times daily.   Yes Historical Provider, MD  lamoTRIgine (LAMICTAL) 200 MG tablet Take 200 mg by  mouth every evening.    Yes Historical Provider, MD  Lurasidone HCl (LATUDA) 60 MG TABS Take 1 tablet by mouth every evening.   Yes Historical Provider, MD  methocarbamol (ROBAXIN) 500 MG tablet Take 500 mg by mouth every 6 (six) hours as needed for muscle spasms.   Yes Historical Provider, MD  omeprazole (PRILOSEC) 40 MG capsule Take 40 mg by mouth every morning.    Yes Historical Provider, MD  oxyCODONE-acetaminophen (PERCOCET) 5-325 MG tablet Take 1 tablet by mouth every 4 (four) hours as needed for moderate pain. [id number]  Yes Delora Fuel, MD    QUEtiapine (SEROQUEL) 100 MG tablet Take 100 mg by mouth at bedtime.   Yes Historical Provider, MD  simvastatin (ZOCOR) 40 MG tablet Take 40 mg by mouth every evening.   Yes Historical Provider, MD  traZODone (DESYREL) 100 MG tablet Take 200 mg by mouth at bedtime.   Yes Historical Provider, MD  albuterol (PROVENTIL HFA;VENTOLIN HFA) 108 (90 BASE) MCG/ACT inhaler Inhale 2 puffs into the lungs every 6 (six) hours as needed for wheezing or shortness of breath.     Historical Provider, MD  azithromycin (ZITHROMAX) 250 MG tablet Take 1 tablet (250 mg total) by mouth daily. Take first 2 tablets together, then 1 every day until finished. 04/28/16 05/02/16  Fatima Blank, MD  beclomethasone (QVAR) 80 MCG/ACT inhaler Inhale 1 puff into the lungs 2 (two) times daily as needed (shortness of breath).     Historical Provider, MD  BusPIRone HCl (BUSPAR PO) Take 2 tablets by mouth at bedtime as needed (sleep.).    Historical Provider, MD  gabapentin (NEURONTIN) 400 MG capsule Take 400 mg by mouth 3 (three) times daily.    Historical Provider, MD  hydrOXYzine (ATARAX/VISTARIL) 50 MG tablet Take 50-100 mg by mouth at bedtime as needed for anxiety.     Historical Provider, MD  LURASIDONE HCL PO Take by mouth at bedtime.    Historical Provider, MD  meloxicam (MOBIC) 7.5 MG tablet Take 1 tablet (7.5 mg total) by mouth daily. Patient not taking: Reported on 04/27/2016 [id number]   Delora Fuel, MD  nitroGLYCERIN (NITRODUR - DOSED IN MG/24 HR) 0.2 mg/hr patch Place 0.2 mg onto the skin daily.    Historical Provider, MD  nitroGLYCERIN (NITROSTAT) 0.4 MG SL tablet Place 0.4 mg under the tongue every 5 (five) minutes as needed for chest pain.    Historical Provider, MD  predniSONE (DELTASONE) 10 MG tablet Take 6 tablets (60 mg total) by mouth daily. 04/28/16 05/02/16  Fatima Blank, MD  topiramate (TOPAMAX) 50 MG tablet Take 50 mg by mouth 2 (two) times daily as needed (migraines).     Historical Provider, MD     Family History Family History  Problem Relation Age of Onset  . Hypertension Mother   . Heart disease Mother   . Diabetes Mother   . Prostate cancer Other   . Cancer Maternal Aunt     Social History Social History  Substance Use Topics  . Smoking status: Current Every Day Smoker    Packs/day: 0.25    Types: Cigarettes  . Smokeless tobacco: Never Used  . Alcohol use No     Allergies   Latex and Penicillins   Review of Systems Review of Systems  Constitutional: Positive for chills.  HENT: Positive for rhinorrhea and sore throat.   Respiratory: Positive for cough and shortness of breath.   Cardiovascular: Positive for chest pain.  Gastrointestinal: Positive for diarrhea. Negative  for nausea and vomiting.  Musculoskeletal: Positive for back pain and myalgias.  Neurological: Positive for weakness.   Ten systems are reviewed and are negative for acute change except as noted in the HPI   Physical Exam Updated Vital Signs BP (!) 181/109 (BP Location: Right Arm)   Pulse 109   Temp 98.9 F (37.2 C) (Oral)   Resp 18   Ht 5' 3.5" (1.613 m)   Wt 130 lb (59 kg)   SpO2 93%   BMI 22.67 kg/m   Physical Exam  Constitutional: She is oriented to person, place, and time. She appears well-developed and well-nourished. No distress.  HENT:  Head: Normocephalic and atraumatic.  Nose: Nose normal.  Eyes: Conjunctivae and EOM are normal. Pupils are equal, round, and reactive to light. Right eye exhibits no discharge. Left eye exhibits no discharge. No scleral icterus.  Neck: Normal range of motion. Neck supple.  Cardiovascular: Normal rate and regular rhythm.  Exam reveals no gallop and no friction rub.   No murmur heard. Pulmonary/Chest: Effort normal. No stridor. No respiratory distress. She has no decreased breath sounds. She has wheezes. She has no rhonchi. She has no rales.  Abdominal: Soft. She exhibits no distension. There is no tenderness.  Musculoskeletal: She  exhibits no edema or tenderness.  Neurological: She is alert and oriented to person, place, and time.  Skin: Skin is warm and dry. No rash noted. She is not diaphoretic. No erythema.  Psychiatric: She has a normal mood and affect.  Vitals reviewed.    ED Treatments / Results  Labs (all labs ordered are listed, but only abnormal results are displayed) Labs Reviewed  BASIC METABOLIC PANEL - Abnormal; Notable for the following:       Result Value   Potassium 3.3 (*)    All other components within normal limits  CBC - Abnormal; Notable for the following:    WBC 11.2 (*)    RBC 5.24 (*)    RDW 16.5 (*)    All other components within normal limits  I-STAT TROPOININ, ED  I-STAT TROPOININ, ED    EKG  EKG Interpretation  Date/Time:  Friday April 27 2016 17:57:18 EST Ventricular Rate:  114 PR Interval:    QRS Duration: 76 QT Interval:  339 QTC Calculation: 467 R Axis:   56 Text Interpretation:  Sinus tachycardia Biatrial enlargement Probable LVH with secondary repol abnrm Baseline wander in lead(s) V4 nonspecific ST/T changes new since 2015 Confirmed by GOLDSTON MD, SCOTT [phone]) on 04/27/2016 6:05:56 PM       Radiology Dg Chest 2 View  Result Date: 04/27/2016 CLINICAL DATA:  Productive cough and chest discomfort for 4 days. Personal history of endometrial carcinoma. EXAM: CHEST  2 VIEW COMPARISON:  None. FINDINGS: The heart size and mediastinal contours are within normal limits. Aortic atherosclerosis. Both lungs are clear. The visualized skeletal structures are unremarkable. IMPRESSION: No active cardiopulmonary disease. Aortic atherosclerosis. Electronically Signed   By: Earle Gell M.D.   On: 04/27/2016 18:44    Procedures Procedures (including critical care time)  Medications Ordered in ED Medications  albuterol (PROVENTIL HFA;VENTOLIN HFA) 108 (90 Base) MCG/ACT inhaler 2 puff (not administered)  ipratropium-albuterol (DUONEB) 0.5-2.5 (3) MG/3ML nebulizer solution 3  mL (3 mLs Nebulization Given 04/27/16 1918)  predniSONE (DELTASONE) tablet 60 mg (60 mg Oral Given 04/27/16 1945)  azithromycin (ZITHROMAX) tablet 500 mg (500 mg Oral Given 04/27/16 1945)     Initial Impression / Assessment and Plan / ED Course  I have reviewed the triage vital signs and the nursing notes.  Pertinent labs & imaging results that were available during my care of the patient were reviewed by me and considered in my medical decision making (see chart for details).  Clinical Course     1. Cough, URI sx H/o smoking w/o formal dx of COPD. CXR w/o PNA. Does have Leukocytosis. No focal lungs sound but does have wheezing. Treated with breathing tx, steroids, and Azithro for presumed COPD exacerbation.  Significant improvement after breathing treatment.  2. Chest pain  Atypical that is persistent for >16 hrs. EKG with nonspecific changes. Serial trop negative 3 hours apart. Feel this is sufficient to rule out ACS. Low suspicion for PE, dissection, or Eso perforation.  The patient is safe for discharge with strict return precautions.   Final Clinical Impressions(s) / ED Diagnoses   Final diagnoses:  Bronchitis  Chest pain, unspecified type   Disposition: Discharge  Condition: Good  I have discussed the results, Dx and Tx plan with the patient who expressed understanding and agree(s) with the plan. Discharge instructions discussed at great length. The patient was given strict return precautions who verbalized understanding of the instructions. No further questions at time of discharge.    New Prescriptions   AZITHROMYCIN (ZITHROMAX) 250 MG TABLET    Take 1 tablet (250 mg total) by mouth daily. Take first 2 tablets together, then 1 every day until finished.   PREDNISONE (DELTASONE) 10 MG TABLET    Take 6 tablets (60 mg total) by mouth daily.    Follow Up: Rogers Blocker, MD [address] [place] [address] [phone]  Schedule an appointment as soon as  possible for a visit  in 3-5 days, If symptoms do not improve or  worsen      Fatima Blank, MD 04/27/16 2246

## 2016-05-23 ENCOUNTER — Ambulatory Visit: Payer: Medicare HMO | Admitting: *Deleted

## 2016-05-23 DIAGNOSIS — N898 Other specified noninflammatory disorders of vagina: Secondary | ICD-10-CM

## 2016-05-23 LAB — WET PREP, GENITAL: YEAST WET PREP: NONE SEEN

## 2016-05-23 NOTE — Progress Notes (Signed)
Pt reports that she recently finished antibiotics and steroid taper for bronchitis and has been having vaginal discharge.  She denies itching but does endorse having an odor. Pt performed self vaginal swab.  She will be contacted with results.

## 2016-05-24 ENCOUNTER — Other Ambulatory Visit: Payer: Self-pay | Admitting: Family Medicine

## 2016-05-24 MED ORDER — METRONIDAZOLE 500 MG PO TABS: 500.0000 mg | ORAL_TABLET | Freq: Two times a day (BID) | ORAL | 0 refills | Status: DC

## 2016-05-31 ENCOUNTER — Telehealth: Payer: Self-pay

## 2016-05-31 NOTE — Telephone Encounter (Signed)
Per Nehemiah Settle, Flagyl 500 mg po bid x 7 days has been prescribed for BV and + Trich.  Notified pt of results and the need for tx.  Pt stated that she was on the last pill today of the antibiotic.  I advised pt to make sure that her partner(s) are treated as well.  Pt stated understanding with no further questions.

## 2016-06-18 ENCOUNTER — Ambulatory Visit (INDEPENDENT_AMBULATORY_CARE_PROVIDER_SITE_OTHER): Payer: Medicare HMO | Admitting: Obstetrics & Gynecology

## 2016-06-18 ENCOUNTER — Encounter: Payer: Self-pay | Admitting: Obstetrics & Gynecology

## 2016-06-18 ENCOUNTER — Other Ambulatory Visit (HOSPITAL_COMMUNITY)
Admission: RE | Admit: 2016-06-18 | Discharge: 2016-06-18 | Disposition: A | Discharge status: Home / Self Care | Payer: Medicare HMO | Source: Ambulatory Visit | Attending: Obstetrics & Gynecology | Admitting: Obstetrics & Gynecology

## 2016-06-18 VITALS — BP 161/97 | HR 81 | Ht 63.5 in | Wt 134.8 lb

## 2016-06-18 DIAGNOSIS — Z01411 Encounter for gynecological examination (general) (routine) with abnormal findings: Secondary | ICD-10-CM | POA: Insufficient documentation

## 2016-06-18 DIAGNOSIS — Z0000 Encounter for general adult medical examination without abnormal findings: Secondary | ICD-10-CM

## 2016-06-18 DIAGNOSIS — Z1151 Encounter for screening for human papillomavirus (HPV): Secondary | ICD-10-CM | POA: Insufficient documentation

## 2016-06-18 DIAGNOSIS — Z01419 Encounter for gynecological examination (general) (routine) without abnormal findings: Secondary | ICD-10-CM

## 2016-06-18 DIAGNOSIS — Z1239 Encounter for other screening for malignant neoplasm of breast: Secondary | ICD-10-CM

## 2016-06-18 NOTE — Patient Instructions (Signed)
Return to clinic for any scheduled appointments or for any gynecologic concerns as needed.   

## 2016-06-18 NOTE — Progress Notes (Signed)
GYNECOLOGY ANNUAL PREVENTATIVE CARE ENCOUNTER NOTE  Subjective:   [name] is a 56 y.o. [id number] female here for a routine annual gynecologic exam.  Current complaints: none.   Denies abnormal vaginal bleeding, discharge, pelvic pain, problems with intercourse or other gynecologic concerns.    Gynecologic History No LMP recorded. Patient has had a TAH/BSO for endometrial cancer Last Pap: 2015. Results were: normal Last mammogram: 06/29/2016. Results were: normal  Obstetric History OB History  Gravida Para Term Preterm AB Living  2 2 1     2   SAB TAB Ectopic Multiple Live Births               # Outcome Date GA Lbr Len/2nd Weight Sex Delivery Anes PTL Lv  2 Para      Vag-Spont     1 Term  [redacted]w[redacted]d    Vag-Spont         Past Medical History:  Diagnosis Date  . Bipolar 1 disorder ([place])   . Cancer [facility])    endometrial  . Chest pain   . CVA (cerebrovascular accident) ([place]) 2007  . Depression   . GERD (gastroesophageal reflux disease)   . Hypertension   . PTSD (post-traumatic stress disorder)   . Thyroid disease     Past Surgical History:  Procedure Laterality Date  . ABDOMINAL HYSTERECTOMY  07/2009  . SALPINGOOPHORECTOMY    . TUBAL LIGATION      Current Outpatient Prescriptions on File Prior to Visit  Medication Sig Dispense Refill  . albuterol (PROVENTIL HFA;VENTOLIN HFA) 108 (90 BASE) MCG/ACT inhaler Inhale 2 puffs into the lungs every 6 (six) hours as needed for wheezing or shortness of breath.     Marland Kitchen aspirin 325 MG tablet Take 325 mg by mouth every morning.     . beclomethasone (QVAR) 80 MCG/ACT inhaler Inhale 1 puff into the lungs 2 (two) times daily as needed (shortness of breath).     . BusPIRone HCl (BUSPAR PO) Take 2 tablets by mouth at bedtime as needed (sleep.).    Marland Kitchen ferrous gluconate (FERGON) 324 MG tablet Take 324 mg by mouth daily with breakfast.    . gabapentin (NEURONTIN) 600 MG tablet Take 600 mg by mouth 3 (three) times daily.    Marland Kitchen lamoTRIgine  (LAMICTAL) 200 MG tablet Take 200 mg by mouth every evening.     . Lurasidone HCl (LATUDA) 60 MG TABS Take 1 tablet by mouth every evening.    . meloxicam (MOBIC) 7.5 MG tablet Take 1 tablet (7.5 mg total) by mouth daily. 15 tablet 0  . methocarbamol (ROBAXIN) 500 MG tablet Take 500 mg by mouth every 6 (six) hours as needed for muscle spasms.    . nitroGLYCERIN (NITRODUR - DOSED IN MG/24 HR) 0.2 mg/hr patch Place 0.2 mg onto the skin daily.    . nitroGLYCERIN (NITROSTAT) 0.4 MG SL tablet Place 0.4 mg under the tongue every 5 (five) minutes as needed for chest pain.    Marland Kitchen omeprazole (PRILOSEC) 40 MG capsule Take 40 mg by mouth every morning.     Marland Kitchen oxyCODONE-acetaminophen (PERCOCET) 5-325 MG tablet Take 1 tablet by mouth every 4 (four) hours as needed for moderate pain. 15 tablet 0  . QUEtiapine (SEROQUEL) 100 MG tablet Take 100 mg by mouth at bedtime.    . simvastatin (ZOCOR) 40 MG tablet Take 40 mg by mouth every evening.    . topiramate (TOPAMAX) 50 MG tablet Take 50 mg by mouth 2 (two) times  daily as needed (migraines).     . busPIRone (BUSPAR) 5 MG tablet Take 5 mg by mouth 2 (two) times daily.    Marland Kitchen gabapentin (NEURONTIN) 400 MG capsule Take 400 mg by mouth 3 (three) times daily.    . hydrOXYzine (ATARAX/VISTARIL) 50 MG tablet Take 50-100 mg by mouth at bedtime as needed for anxiety.      No current facility-administered medications on file prior to visit.     Allergies  Allergen Reactions  . Other Anaphylaxis    Fire ants  . Latex Rash    Has patient had a PCN reaction causing immediate rash, facial/tongue/throat swelling, SOB or lightheadedness with hypotension: Yes Has patient had a PCN reaction causing severe rash involving mucus membranes or skin necrosis: No  Has patient had a PCN reaction that required hospitalization No Has patient had a PCN reaction occurring within the last 10 years: No If all of the above answers are "NO", then may proceed with Cephalosporin use.   Marland Kitchen  Penicillins Itching and Rash    Social History   Social History  . Marital status: Single    Spouse name: N/A  . Number of children: N/A  . Years of education: N/A   Occupational History  . Not on file.   Social History Main Topics  . Smoking status: Current Some Day Smoker    Packs/day: 0.25    Types: Cigarettes  . Smokeless tobacco: Never Used     Comment: trying to stop smoking   . Alcohol use No  . Drug use: No  . Sexual activity: Not Currently    Birth control/ protection: Surgical   Other Topics Concern  . Not on file   Social History Narrative  . No narrative on file    Family History  Problem Relation Age of Onset  . Hypertension Mother   . Heart disease Mother   . Diabetes Mother   . Prostate cancer Other   . Cancer Maternal Aunt     The following portions of the patient's history were reviewed and updated as appropriate: allergies, current medications, past family history, past medical history, past social history, past surgical history and problem list.  Review of Systems Pertinent items noted in HPI and remainder of comprehensive ROS otherwise negative.   Objective:  BP (!) 161/97   Pulse 81   Ht 5' 3.5" (1.613 m)   Wt 134 lb 12.8 oz (61.1 kg)   BMI 23.50 kg/m  CONSTITUTIONAL: Well-developed, well-nourished female in no acute distress.  HENT:  Normocephalic, atraumatic, External right and left ear normal. Oropharynx is clear and moist EYES: Conjunctivae and EOM are normal. Pupils are equal, round, and reactive to light. No scleral icterus.  NECK: Normal range of motion, supple, no masses.  Normal thyroid.  SKIN: Skin is warm and dry. No rash noted. Not diaphoretic. No erythema. No pallor. NEUROLOGIC: Alert and oriented to person, place, and time. Normal reflexes, muscle tone coordination. No cranial nerve deficit noted. PSYCHIATRIC: Normal mood and affect. Normal behavior. Normal judgment and thought content. CARDIOVASCULAR: Normal heart rate  noted, regular rhythm RESPIRATORY: Clear to auscultation bilaterally. Effort and breath sounds normal, no problems with respiration noted. BREASTS: Symmetric in size. No masses, skin changes, nipple drainage, or lymphadenopathy. ABDOMEN: Soft, normal bowel sounds, no distention noted.  No tenderness, rebound or guarding.  PELVIC: Normal appearing external genitalia and vaginal mucosa and cuff.  Moderate atrophy noted. No abnormal discharge noted.  Vaginal pap smear obtained.  MUSCULOSKELETAL: Normal range of motion. No tenderness.  No cyanosis, clubbing, or edema.  2+ distal pulses.   Assessment:  Annual gynecologic examination with pap smear History of endometrial cancer   Plan:  Will follow up results of pap smear and manage accordingly. Mammogram scheduled next month. Routine preventative health maintenance measures emphasized. Please refer to After Visit Summary for other counseling recommendations.    Verita Schneiders, MD, [place] Attending Obstetrician & Gynecologist, [place] [facility]

## 2016-06-21 LAB — CYTOLOGY - PAP
Diagnosis: NEGATIVE
HPV (WINDOPATH): NOT DETECTED

## 2016-06-29 ENCOUNTER — Ambulatory Visit
Admission: RE | Admit: 2016-06-29 | Discharge: 2016-06-29 | Disposition: A | Discharge status: Home / Self Care | Payer: Medicare HMO | Source: Ambulatory Visit | Attending: Obstetrics & Gynecology | Admitting: Obstetrics & Gynecology

## 2016-06-29 ENCOUNTER — Other Ambulatory Visit: Payer: Self-pay | Admitting: Obstetrics & Gynecology

## 2016-06-29 DIAGNOSIS — Z1239 Encounter for other screening for malignant neoplasm of breast: Secondary | ICD-10-CM

## 2016-08-22 DIAGNOSIS — F4310 Post-traumatic stress disorder, unspecified: Secondary | ICD-10-CM | POA: Diagnosis not present

## 2016-08-29 DIAGNOSIS — R7303 Prediabetes: Secondary | ICD-10-CM | POA: Diagnosis not present

## 2016-08-29 DIAGNOSIS — M2550 Pain in unspecified joint: Secondary | ICD-10-CM | POA: Diagnosis not present

## 2016-08-29 DIAGNOSIS — R079 Chest pain, unspecified: Secondary | ICD-10-CM | POA: Diagnosis not present

## 2016-08-29 DIAGNOSIS — Z720 Tobacco use: Secondary | ICD-10-CM | POA: Diagnosis not present

## 2016-09-06 ENCOUNTER — Encounter: Payer: Self-pay | Admitting: Gastroenterology

## 2016-09-12 DIAGNOSIS — R079 Chest pain, unspecified: Secondary | ICD-10-CM | POA: Diagnosis not present

## 2016-09-12 DIAGNOSIS — M2550 Pain in unspecified joint: Secondary | ICD-10-CM | POA: Diagnosis not present

## 2016-09-17 ENCOUNTER — Encounter: Payer: Self-pay | Admitting: Cardiovascular Disease

## 2016-09-17 ENCOUNTER — Ambulatory Visit (INDEPENDENT_AMBULATORY_CARE_PROVIDER_SITE_OTHER): Payer: PPO | Admitting: Cardiovascular Disease

## 2016-09-17 VITALS — BP 142/74 | HR 83 | Ht 63.0 in | Wt 132.2 lb

## 2016-09-17 DIAGNOSIS — E785 Hyperlipidemia, unspecified: Secondary | ICD-10-CM | POA: Diagnosis not present

## 2016-09-17 DIAGNOSIS — R079 Chest pain, unspecified: Secondary | ICD-10-CM | POA: Diagnosis not present

## 2016-09-17 DIAGNOSIS — I10 Essential (primary) hypertension: Secondary | ICD-10-CM

## 2016-09-17 DIAGNOSIS — Z79899 Other long term (current) drug therapy: Secondary | ICD-10-CM

## 2016-09-17 NOTE — Progress Notes (Signed)
Cardiology Office Note   Date:  09/19/2016   ID:  [name], DOB 1961-03-13, MRN [id number]  PCP:  Rogers Blocker, MD  Cardiologist:   Skeet Latch, MD   Chief Complaint  Patient presents with  . New Evaluation    pt c/o chest pain, she stated when taking deep breath she feels pain in chest      History of Present Illness: [name] is a 56 y.o. female with hypertension, prior stroke, tobacco abuse, and bipolar disorder, who presents for an evaluation of chest pain.  She last saw Dr. Kevan Ny on 08/29/16 and reported persistent, dull chest pain.  She was referred to cardiology for further evaluation. [name] saw Dr. Johnsie Cancel in 2011 for atypical chest pain and palpitations.  Her symptoms were felt to be consistent with panic attacks. She had a stress test in 2007 that was reportedly negative for ischemia.  She had a cardiac CT angiography 03/2014 that revealed 0-0.25% left main stenosis and 25-50% proximal LAD stenosis.  She was previously treated with clonazepam but this was recently held. She notes that her anxiety has been poorly controlled bedtime. She reports episodes of chest pain with associated diaphoresis and nausea. When it occurs she has to lay down and rest. Sublingual nitroglycerin helps. The chest pain occurs with exertion and also awakens her from sleep. It is 8 out of 10 in severity and does not radiate. The symptoms started one year ago but have been worse lately.  [name] smokes two cigarettes daily.  She is trying to quit.   Past Medical History:  Diagnosis Date  . Bipolar 1 disorder ([place])   . Cancer [facility])    endometrial  . Chest pain   . CVA (cerebrovascular accident) ([place]) 2007  . Depression   . GERD (gastroesophageal reflux disease)   . Hypertension   . PTSD (post-traumatic stress disorder)   . Thyroid disease     Past Surgical History:  Procedure Laterality Date  . ABDOMINAL HYSTERECTOMY  07/2009  . SALPINGOOPHORECTOMY    . TUBAL LIGATION        Current Outpatient Prescriptions  Medication Sig Dispense Refill  . albuterol (PROVENTIL HFA;VENTOLIN HFA) 108 (90 BASE) MCG/ACT inhaler Inhale 2 puffs into the lungs every 6 (six) hours as needed for wheezing or shortness of breath.     Marland Kitchen aspirin 325 MG tablet Take 325 mg by mouth every morning.     . beclomethasone (QVAR) 80 MCG/ACT inhaler Inhale 1 puff into the lungs 2 (two) times daily as needed (shortness of breath).     . busPIRone (BUSPAR) 5 MG tablet Take 5 mg by mouth 2 (two) times daily.    . BusPIRone HCl (BUSPAR PO) Take 2 tablets by mouth at bedtime as needed (sleep.).    Marland Kitchen ferrous gluconate (FERGON) 324 MG tablet Take 324 mg by mouth daily with breakfast.    . gabapentin (NEURONTIN) 600 MG tablet Take 600 mg by mouth 3 (three) times daily.    . hydrOXYzine (ATARAX/VISTARIL) 50 MG tablet Take 50-100 mg by mouth at bedtime as needed for anxiety.     . lamoTRIgine (LAMICTAL) 200 MG tablet Take 200 mg by mouth every evening.     . Lurasidone HCl (LATUDA) 60 MG TABS Take 1 tablet by mouth every evening.    . meloxicam (MOBIC) 7.5 MG tablet Take 1 tablet (7.5 mg total) by mouth daily. 15 tablet 0  . methocarbamol (ROBAXIN) 500 MG  tablet Take 500 mg by mouth every 6 (six) hours as needed for muscle spasms.    . nitroGLYCERIN (NITRODUR - DOSED IN MG/24 HR) 0.2 mg/hr patch Place 0.2 mg onto the skin daily.    . nitroGLYCERIN (NITROSTAT) 0.4 MG SL tablet Place 0.4 mg under the tongue every 5 (five) minutes as needed for chest pain.    Marland Kitchen omeprazole (PRILOSEC) 40 MG capsule Take 40 mg by mouth every morning.     Marland Kitchen oxyCODONE-acetaminophen (PERCOCET) 5-325 MG tablet Take 1 tablet by mouth every 4 (four) hours as needed for moderate pain. 15 tablet 0  . QUEtiapine (SEROQUEL) 100 MG tablet Take 100 mg by mouth at bedtime.    . simvastatin (ZOCOR) 40 MG tablet Take 40 mg by mouth every evening.    . topiramate (TOPAMAX) 50 MG tablet Take 50 mg by mouth 2 (two) times daily as needed  (migraines).      No current facility-administered medications for this visit.     Allergies:   Other; Latex; and Penicillins    Social History:  The patient  reports that she has been smoking Cigarettes.  She has been smoking about 0.25 packs per day. She has never used smokeless tobacco. She reports that she does not drink alcohol or use drugs.   Family History:  [name] family history includes CAD in her brother and sister; Cancer in her maternal aunt; Diabetes in her mother; Heart disease in her mother; Hypertension in her mother; Prostate cancer in her other.    ROS:  Please see the history of present illness.   Otherwise, review of systems are positive for none.   All other systems are reviewed and negative.    PHYSICAL EXAM: VS:  BP (!) 142/74   Pulse 83   Ht 5\' 3"  (1.6 m)   Wt 60 kg (132 lb 3.2 oz)   BMI 23.42 kg/m  , BMI Body mass index is 23.42 kg/m. GENERAL:  Well appearing HEENT:  Pupils equal round and reactive, fundi not visualized, oral mucosa unremarkable NECK:  No jugular venous distention, waveform within normal limits, carotid upstroke brisk and symmetric, no bruits, no thyromegaly LYMPHATICS:  No cervical adenopathy LUNGS:  Clear to auscultation bilaterally HEART:  RRR.  PMI not displaced or sustained,S1 and S2 within normal limits, no S3, no S4, no clicks, no rubs, no murmurs ABD:  Flat, positive bowel sounds normal in frequency in pitch, no bruits, no rebound, no guarding, no midline pulsatile mass, no hepatomegaly, no splenomegaly EXT:  2 plus pulses throughout, no edema, no cyanosis no clubbing SKIN:  No rashes no nodules NEURO:  Cranial nerves II through XII grossly intact, motor grossly intact throughout PSYCH:  Cognitively intact, oriented to person place and time   EKG:  EKG is ordered today. The ekg ordered today demonstrates sinus rhythm.  Rate 83 bpm.  LVH with repolarizationa bnormalities.    Cardiac CT-A 04/12/14: 25-50% LAD, 0-25% LM,  0-25% OM.  Coronary calcium score 98 %ile.   Recent Labs: 04/27/2016: BUN 12; Creatinine, Ser 0.91; Hemoglobin 13.8; Platelets 188; Potassium 3.3; Sodium 138    Lipid Panel    Component Value Date/Time   CHOL 221 (H) 03/29/2010 2024   TRIG 176 (H) 03/29/2010 2024   HDL 40 03/29/2010 2024   CHOLHDL 5.5 Ratio 03/29/2010 2024   VLDL 35 03/29/2010 2024   LDLCALC 146 (H) 03/29/2010 2024      Wt Readings from Last 3 Encounters:  09/17/16 60 kg (  132 lb 3.2 oz)  06/18/16 61.1 kg (134 lb 12.8 oz)  04/27/16 59 kg (130 lb)     ASSESSMENT AND PLAN:  # CAD: # Angina: [name] had mild-moderate obstruction on cardiac CT-A 03/2014.  Her symptoms are atypical but she does have some exertional symptoms. We will obtain a Lexiscan Myoview to assess.  # Tobacco abuse: [name] was encouraged to continue smoking cessation.  Continue nicotine patches.   # Hypertension: Blood pressure is above goal. She wants to work on diet and exercise before making any changes.  # Hyperlipidemia: Check lipids and CMP.  Continue simvastatin.     Current medicines are reviewed at length with the patient today.  The patient does not have concerns regarding medicines.  The following changes have been made:  no change  Labs/ tests ordered today include:   Orders Placed This Encounter  Procedures  . Lipid panel  . Comprehensive metabolic panel  . Myocardial Perfusion Imaging  . EKG 12-Lead     Disposition:   FU with Karina Siller C. Oval Linsey, MD, [facility] in 4 months    This note was written with the assistance of speech recognition software.  Please excuse any transcriptional errors.  Signed, Nyheim Seufert C. Oval Linsey, MD, [facility]  09/19/2016 5:12 PM    [place]

## 2016-09-17 NOTE — Patient Instructions (Signed)
Medication Instructions:  Your physician recommends that you continue on your current medications as directed. Please refer to the Current Medication list given to you today.  Labwork: FASTING LP/CMET AT [facility] ON THE FIRST FLOOR WHEN YOU RETURN FOR YOUR LEXISCAN   Testing/Procedures: Your physician has requested that you have a lexiscan myoview. For further information please visit [email or web address]. Please follow instruction sheet, as given.  Follow-Up: Your physician recommends that you schedule a follow-up appointment in: [place]   If you need a refill on your cardiac medications before your next appointment, please call your pharmacy.

## 2016-09-18 ENCOUNTER — Ambulatory Visit: Payer: Medicare HMO | Admitting: Cardiovascular Disease

## 2016-09-26 ENCOUNTER — Telehealth (HOSPITAL_COMMUNITY): Payer: Self-pay

## 2016-09-26 NOTE — Telephone Encounter (Signed)
Encounter complete. 

## 2016-09-27 ENCOUNTER — Telehealth (HOSPITAL_COMMUNITY): Payer: Self-pay

## 2016-09-27 NOTE — Telephone Encounter (Signed)
Encounter complete. 

## 2016-09-28 ENCOUNTER — Ambulatory Visit (HOSPITAL_COMMUNITY)
Admission: RE | Admit: 2016-09-28 | Discharge: 2016-09-28 | Disposition: A | Discharge status: Home / Self Care | Payer: PPO | Source: Ambulatory Visit | Attending: Cardiovascular Disease | Admitting: Cardiovascular Disease

## 2016-09-28 DIAGNOSIS — Z8542 Personal history of malignant neoplasm of other parts of uterus: Secondary | ICD-10-CM | POA: Insufficient documentation

## 2016-09-28 DIAGNOSIS — I10 Essential (primary) hypertension: Secondary | ICD-10-CM | POA: Insufficient documentation

## 2016-09-28 DIAGNOSIS — F17200 Nicotine dependence, unspecified, uncomplicated: Secondary | ICD-10-CM | POA: Diagnosis not present

## 2016-09-28 DIAGNOSIS — E785 Hyperlipidemia, unspecified: Secondary | ICD-10-CM | POA: Diagnosis not present

## 2016-09-28 DIAGNOSIS — J45909 Unspecified asthma, uncomplicated: Secondary | ICD-10-CM | POA: Diagnosis not present

## 2016-09-28 DIAGNOSIS — R079 Chest pain, unspecified: Secondary | ICD-10-CM

## 2016-09-28 DIAGNOSIS — Z8249 Family history of ischemic heart disease and other diseases of the circulatory system: Secondary | ICD-10-CM | POA: Diagnosis not present

## 2016-09-28 LAB — MYOCARDIAL PERFUSION IMAGING
CHL CUP NUCLEAR SDS: 2
CHL CUP RESTING HR STRESS: 66 {beats}/min
CSEPPHR: 88 {beats}/min
LV dias vol: 96 mL (ref 46–106)
LVSYSVOL: 51 mL
SRS: 1
SSS: 3
TID: 1.13

## 2016-09-28 MED ORDER — REGADENOSON 0.4 MG/5ML IV SOLN
0.4000 mg | Freq: Once | INTRAVENOUS | Status: AC
  Administered 2016-09-28: 0.4 mg via INTRAVENOUS

## 2016-09-28 MED ORDER — TECHNETIUM TC 99M TETROFOSMIN IV KIT
32.0000 | PACK | Freq: Once | INTRAVENOUS | Status: AC | PRN
  Administered 2016-09-28: 32 via INTRAVENOUS
  Filled 2016-09-28: qty 32

## 2016-09-28 MED ORDER — TECHNETIUM TC 99M TETROFOSMIN IV KIT
11.0000 | PACK | Freq: Once | INTRAVENOUS | Status: AC | PRN
  Administered 2016-09-28: 11 via INTRAVENOUS
  Filled 2016-09-28: qty 11

## 2016-09-28 MED ORDER — AMINOPHYLLINE 25 MG/ML IV SOLN
75.0000 mg | Freq: Once | INTRAVENOUS | Status: AC
  Administered 2016-09-28: 75 mg via INTRAVENOUS

## 2016-10-03 ENCOUNTER — Other Ambulatory Visit: Payer: Self-pay

## 2016-10-03 DIAGNOSIS — R079 Chest pain, unspecified: Secondary | ICD-10-CM

## 2016-10-03 DIAGNOSIS — R0600 Dyspnea, unspecified: Secondary | ICD-10-CM

## 2016-10-08 ENCOUNTER — Ambulatory Visit (HOSPITAL_COMMUNITY): Payer: PPO | Attending: Cardiology

## 2016-10-08 ENCOUNTER — Other Ambulatory Visit: Payer: Self-pay

## 2016-10-08 DIAGNOSIS — R0600 Dyspnea, unspecified: Secondary | ICD-10-CM | POA: Insufficient documentation

## 2016-10-08 DIAGNOSIS — R079 Chest pain, unspecified: Secondary | ICD-10-CM | POA: Insufficient documentation

## 2016-10-10 DIAGNOSIS — M549 Dorsalgia, unspecified: Secondary | ICD-10-CM | POA: Diagnosis not present

## 2016-10-10 DIAGNOSIS — Z720 Tobacco use: Secondary | ICD-10-CM | POA: Diagnosis not present

## 2016-10-10 DIAGNOSIS — R634 Abnormal weight loss: Secondary | ICD-10-CM | POA: Diagnosis not present

## 2016-10-10 DIAGNOSIS — E559 Vitamin D deficiency, unspecified: Secondary | ICD-10-CM | POA: Diagnosis not present

## 2016-10-10 DIAGNOSIS — R079 Chest pain, unspecified: Secondary | ICD-10-CM | POA: Diagnosis not present

## 2016-10-11 ENCOUNTER — Telehealth: Payer: Self-pay | Admitting: Cardiovascular Disease

## 2016-10-11 NOTE — Telephone Encounter (Signed)
°  Follow Up   Following up on echocardiogram results. Please call.

## 2016-10-11 NOTE — Telephone Encounter (Signed)
Left msg to call.

## 2016-10-12 NOTE — Telephone Encounter (Signed)
Please call,pt is returning Nathan's call from yesterday.Marland Kitchen

## 2016-10-12 NOTE — Telephone Encounter (Signed)
Spoke to patient.  Echo Result given . Verbalized understanding.    Patient states she is still having chest pain that is requiring NTG usage. PATIENT STATES the pain comes and goes. At time is relieved with ntg.   RN instructed patient on how to use NTG appropriately If symptoms are not resolved, call 911. Patient verbalized understanding. Patient aware will defer to Dr Oval Linsey  for next options

## 2016-10-12 NOTE — Telephone Encounter (Signed)
Left message to call back on voice mail

## 2016-10-15 NOTE — Telephone Encounter (Signed)
[name] just had a stress test that did not show any significant blockages in the heart arteries.  We can get a chest CT-A to rule out pulmonary embolism

## 2016-10-17 NOTE — Telephone Encounter (Signed)
Spoke with patient yesterday regarding recommended test. Per patient she had a cardiac cath a couple of years ago by Dr Cleon Dew and told she needed two stents. She refused at that time. Reviewed chart and found no cath but found Coronary CT-A. Discussed with Dr Oval Linsey and even though patient had normal myoview they are not 100 %. Called patient to discuss and she would like to proceed with cardiac cath.  Scheduled OV for tomorrow with Dr Oval Linsey since her last ov 09/17/16. Patient aware of time and location.

## 2016-10-18 ENCOUNTER — Encounter: Payer: Self-pay | Admitting: Cardiovascular Disease

## 2016-10-18 ENCOUNTER — Telehealth: Payer: Self-pay

## 2016-10-18 ENCOUNTER — Ambulatory Visit (INDEPENDENT_AMBULATORY_CARE_PROVIDER_SITE_OTHER): Payer: PPO | Admitting: Cardiovascular Disease

## 2016-10-18 VITALS — BP 148/86 | HR 75 | Ht 63.5 in | Wt 126.2 lb

## 2016-10-18 DIAGNOSIS — E785 Hyperlipidemia, unspecified: Secondary | ICD-10-CM | POA: Diagnosis not present

## 2016-10-18 DIAGNOSIS — Z01812 Encounter for preprocedural laboratory examination: Secondary | ICD-10-CM | POA: Diagnosis not present

## 2016-10-18 DIAGNOSIS — I110 Hypertensive heart disease with heart failure: Secondary | ICD-10-CM | POA: Diagnosis not present

## 2016-10-18 DIAGNOSIS — I200 Unstable angina: Secondary | ICD-10-CM

## 2016-10-18 DIAGNOSIS — D689 Coagulation defect, unspecified: Secondary | ICD-10-CM | POA: Diagnosis not present

## 2016-10-18 NOTE — Telephone Encounter (Signed)
Dr. Havery Moros   Ahmyah Gidley MRN: [id number] is scheduled for a direct screening colonoscopy on 10/29/16 @ 11:30 Am. Her Pre-Visit is 10/25/16.  Please review the notes from the patients cardiologist. Please advise if the colonoscopy should be cancelled until her cardiac issues are resolved. Thanks.    Alvina Filbert B      3:06 PM  Note    Spoke with patient yesterday regarding recommended test. Per patient she had a cardiac cath a couple of years ago by Dr Cleon Dew and told she needed two stents. She refused at that time. Reviewed chart and found no cath but found Coronary CT-A. Discussed with Dr Oval Linsey and even though patient had normal myoview they are not 100 %. Called patient to discuss and she would like to proceed with cardiac cath.  Scheduled OV for tomorrow with Dr Oval Linsey since her last ov 09/17/16. Patient aware of time and location     Riki Sheer, LPN

## 2016-10-18 NOTE — Patient Instructions (Addendum)
Medication Instructions:  DECREASE: Aspirin 81 mg daily  Labwork: Pre Op Lab   Testing/Procedures:   [place] [address] [place] [address] Dept: [phone] Loc: [place]  10/18/2016  You are scheduled for a Cardiac Catheterization on Monday, June 18 with Dr. Peter Martinique.  1. Please arrive at the [facility] (Main Entrance A) at [facility]: [address] [place], [place] [address] at 5:30 AM (two hours before your procedure to ensure your preparation). Free valet parking service is available.   Special note: Every effort is made to have your procedure done on time. Please understand that emergencies sometimes delay scheduled procedures.  2. Diet: Do not eat or drink anything after midnight prior to your procedure except sips of water to take medications.  3. Labs: You will need to have blood drawn on Friday, June 15 at [place], [place]  Open: [place] (Lunch 12:30 - 1:30)   Phone: [phone]. You do not need to be fasting.  4. Medication instructions in preparation for your procedure:    Current Outpatient Prescriptions (Cardiovascular):  .  nitroGLYCERIN (NITRODUR - DOSED IN MG/24 HR) 0.2 mg/hr patch, Place 0.2 mg onto the skin daily. .  nitroGLYCERIN (NITROSTAT) 0.4 MG SL tablet, Place 0.4 mg under the tongue every 5 (five) minutes as needed for chest pain. .  simvastatin (ZOCOR) 40 MG tablet, Take 40 mg by mouth every evening.  Current Outpatient Prescriptions (Respiratory):  .  albuterol (PROVENTIL HFA;VENTOLIN HFA) 108 (90 BASE) MCG/ACT inhaler, Inhale 2 puffs into the lungs every 6 (six) hours as needed for wheezing or shortness of breath.  .  beclomethasone (QVAR) 80 MCG/ACT inhaler, Inhale 1 puff into the lungs 2 (two) times daily as needed (shortness of breath).   Current Outpatient Prescriptions  (Analgesics):  .  aspirin 325 MG tablet, Take 325 mg by mouth every morning.  .  meloxicam (MOBIC) 7.5 MG tablet, Take 1 tablet (7.5 mg total) by mouth daily. Marland Kitchen  oxyCODONE-acetaminophen (PERCOCET) 5-325 MG tablet, Take 1 tablet by mouth every 4 (four) hours as needed for moderate pain.  Current Outpatient Prescriptions (Hematological):  .  ferrous gluconate (FERGON) 324 MG tablet, Take 324 mg by mouth daily with breakfast.  Current Outpatient Prescriptions (Other):  .  busPIRone (BUSPAR) 5 MG tablet, Take 5 mg by mouth 2 (two) times daily. Marland Kitchen  gabapentin (NEURONTIN) 600 MG tablet, Take 600 mg by mouth 3 (three) times daily. .  hydrOXYzine (ATARAX/VISTARIL) 50 MG tablet, Take 50-100 mg by mouth at bedtime as needed for anxiety.  .  lamoTRIgine (LAMICTAL) 200 MG tablet, Take 200 mg by mouth every evening.  .  Lurasidone HCl (LATUDA) 60 MG TABS, Take 1 tablet by mouth every evening. .  methocarbamol (ROBAXIN) 500 MG tablet, Take 500 mg by mouth every 6 (six) hours as needed for muscle spasms. Marland Kitchen  omeprazole (PRILOSEC) 40 MG capsule, Take 40 mg by mouth every morning.  Marland Kitchen  QUEtiapine (SEROQUEL) 100 MG tablet, Take 100 mg by mouth at bedtime. .  topiramate (TOPAMAX) 50 MG tablet, Take 50 mg by mouth 2 (two) times daily as needed (migraines).  *For reference purposes while preparing patient instructions.   Delete this med list prior to printing instructions for patient.*  None   On the morning of your procedure, take your medications and any morning medicines NOT listed above.  You  may use sips of water.  5. Plan for one night stay--bring personal belongings. 6. Bring a current list of your medications and current insurance cards. 7. You MUST have a responsible person to drive you home. 8. Someone MUST be with you the first 24 hours after you arrive home or your discharge will be delayed. 9. Please wear clothes that are easy to get on and off and wear slip-on shoes.  Thank you for allowing [place]  to care for you!   -- [facility]   Follow-Up: Your physician recommends that you schedule a follow-up appointment in: After Cath   Any Other Special Instructions Will Be Listed Below (If Applicable).   If you need a refill on your cardiac medications before your next appointment, please call your pharmacy.

## 2016-10-18 NOTE — Telephone Encounter (Signed)
Patient is scheduled for a left heart cath and coronary angiography for unstable angina on 11/12/16. I spoke with [name] and the pre-visit and colonoscopy were cancelled until after her cardiac procedure. Patient will call back and re-schedule after her procedure. Josh Monday,CRNA  was consulted and confirmed that the colonoscopy should be done after cardiac procedures are complete.   Riki Sheer, LPN

## 2016-10-18 NOTE — Telephone Encounter (Signed)
Dr. Oval Linsey,  [name] MRN: [id number] is scheduled for a direct screening colonoscopy on 11/08/16 @ 11:30 Am with Dr. [place] Cellar. We would like Cardiac clearance prior to this procedure. Please advise. Thank you.    Riki Sheer, LPN [place]

## 2016-10-18 NOTE — Telephone Encounter (Signed)
Let's await her cardiology appointment. If she is not cleared for colonoscopy, will cancel. If they think she can proceed (normal recent stress test and echo) then she can keep the appointment. Can you please follow up on her cardiology visit and let me know? thanks

## 2016-10-18 NOTE — Progress Notes (Signed)
Cardiology Office Note   Date:  10/18/2016   ID:  [name], DOB 06-14-1960, MRN [id number]  PCP:  Rogers Blocker, MD  Cardiologist:   Skeet Latch, MD   Chief Complaint  Patient presents with  . Follow-up    Chest pain      History of Present Illness: [name] is a 56 y.o. female with hypertension, prior stroke, tobacco abuse, and bipolar disorder, who presents for follow up on chest pain.  Ms. [name] was seen 08/2016 with chest pain.  She last saw Dr. Kevan Ny on 08/29/16 and reported persistent, dull chest pain.  She was referred to cardiology for further evaluation. [name] saw Dr. Johnsie Cancel in 2011 for atypical chest pain and palpitations.  Her symptoms were felt to be consistent with panic attacks. She had a stress test in 2007 that was reportedly negative for ischemia.  She had a cardiac CT angiography 03/2014 that revealed 0-25% left main stenosis and 25-50% proximal LAD stenosis.  Her symptoms orthopnea consistent with anxiety, but given her history she was referred for [facility] 09/28/16 that was negative for ischemia.it revealed LVEF47% so she was referred for an echocardiogram 10/08/16 that revealedLVEF 55-60% with grade 1 diastolic dysfunction.  [name] continues to have chest pain.  She reports chest pain that occurs when laying down.  It is so severe that she can't move.  She had another episode while in the kitchen trying to cook and she was unable to move.  She takes nitroglycerin which alleviates the pain.  She has been taking up to 5 tablets at a time before she gets relief.  The pain occurs approximately every other day and lasts for several minutes.  She denies lower extremity edema, orhthopnea or PND. She is very concerned because her younger brother had a CABG and recently had another heart attacked.  Her mother had a defibrillator prior to her death.  She continues to smoke 1-2 cigarettes daily.   Past Medical History:  Diagnosis Date  . Bipolar 1  disorder ([place])   . Cancer [facility])    endometrial  . Chest pain   . CVA (cerebrovascular accident) ([place]) 2007  . Depression   . GERD (gastroesophageal reflux disease)   . Hypertension   . PTSD (post-traumatic stress disorder)   . Thyroid disease     Past Surgical History:  Procedure Laterality Date  . ABDOMINAL HYSTERECTOMY  07/2009  . SALPINGOOPHORECTOMY    . TUBAL LIGATION       Current Outpatient Prescriptions  Medication Sig Dispense Refill  . albuterol (PROVENTIL HFA;VENTOLIN HFA) 108 (90 BASE) MCG/ACT inhaler Inhale 2 puffs into the lungs every 6 (six) hours as needed for wheezing or shortness of breath.     Marland Kitchen aspirin EC 81 MG tablet Take 81 mg by mouth daily.    . beclomethasone (QVAR) 80 MCG/ACT inhaler Inhale 1 puff into the lungs 2 (two) times daily as needed (shortness of breath).     . busPIRone (BUSPAR) 5 MG tablet Take 5 mg by mouth 2 (two) times daily.    . ferrous gluconate (FERGON) 324 MG tablet Take 324 mg by mouth daily with breakfast.    . gabapentin (NEURONTIN) 600 MG tablet Take 600 mg by mouth 3 (three) times daily.    . hydrOXYzine (ATARAX/VISTARIL) 50 MG tablet Take 50-100 mg by mouth at bedtime as needed for anxiety.     . lamoTRIgine (LAMICTAL) 200 MG tablet Take 200 mg  by mouth every evening.     . Lurasidone HCl (LATUDA) 60 MG TABS Take 1 tablet by mouth every evening.    . meloxicam (MOBIC) 7.5 MG tablet Take 1 tablet (7.5 mg total) by mouth daily. 15 tablet 0  . methocarbamol (ROBAXIN) 500 MG tablet Take 500 mg by mouth every 6 (six) hours as needed for muscle spasms.    . nitroGLYCERIN (NITRODUR - DOSED IN MG/24 HR) 0.2 mg/hr patch Place 0.2 mg onto the skin daily.    . nitroGLYCERIN (NITROSTAT) 0.4 MG SL tablet Place 0.4 mg under the tongue every 5 (five) minutes as needed for chest pain.    Marland Kitchen omeprazole (PRILOSEC) 40 MG capsule Take 40 mg by mouth every morning.     Marland Kitchen oxyCODONE-acetaminophen (PERCOCET) 5-325 MG tablet Take 1 tablet by mouth every 4  (four) hours as needed for moderate pain. 15 tablet 0  . QUEtiapine (SEROQUEL) 100 MG tablet Take 100 mg by mouth at bedtime.    . simvastatin (ZOCOR) 40 MG tablet Take 40 mg by mouth every evening.    . topiramate (TOPAMAX) 50 MG tablet Take 50 mg by mouth 2 (two) times daily as needed (migraines).      No current facility-administered medications for this visit.     Allergies:   Other; Latex; and Penicillins    Social History:  The patient  reports that she has been smoking Cigarettes.  She has been smoking about 0.25 packs per day. She has never used smokeless tobacco. She reports that she does not drink alcohol or use drugs.   Family History:  [name] family history includes CAD in her brother and sister; Cancer in her maternal aunt; Diabetes in her mother; Heart disease in her mother; Hypertension in her mother; Prostate cancer in her other.    ROS:  Please see the history of present illness.   Otherwise, review of systems are positive for none.   All other systems are reviewed and negative.    PHYSICAL EXAM: VS:  BP (!) 148/86   Pulse 75   Ht 5' 3.5" (1.613 m)   Wt 57.2 kg (126 lb 3.2 oz)   BMI 22.00 kg/m  , BMI Body mass index is 22 kg/m. GENERAL:  Well appearing.  No acute distress. HEENT:  Pupils equal round and reactive, fundi not visualized, oral mucosa unremarkable NECK:  No jugular venous distention, waveform within normal limits, carotid upstroke brisk and symmetric, no bruits LUNGS:  Clear to auscultation bilaterally. No crackles or rhonchi.  Mild, scattered wheezes HEART:  RRR.  PMI not displaced or sustained,S1 and S2 within normal limits, no S3, no S4, no clicks, no rubs, no murmurs ABD:  Flat, positive bowel sounds normal in frequency in pitch, no bruits, no rebound, no guarding, no midline pulsatile mass, no hepatomegaly, no splenomegaly EXT:  2 plus pulses throughout, no edema, no cyanosis no clubbing SKIN:  No rashes no nodules NEURO:  Cranial nerves II  through XII grossly intact, motor grossly intact throughout PSYCH:  Cognitively intact, oriented to person place and time   EKG:  EKG is ordered today. The ekg ordered today demonstrates sinus rhythm.  Rate 83 bpm.  LVH with repolarizationa bnormalities.   10/18/16: Sinus rhythm.  Rate 75 bpm.  LVH with repolarization abnormalities.  Anterolateral TWI more pronounced than prior.  Cardiac CT-A 04/12/14: 25-50% LAD, 0-25% LM, 0-25% OM.  Coronary calcium score 98 %ile.  Lexiscan Myoview 09/28/16:  The left ventricular ejection fraction is  mildly decreased (45-54%).  Nuclear stress EF: 47%.  No T wave inversion was noted during stress.  There was no ST segment deviation noted during stress.  This is an intermediate risk study.  Echo 10/08/16: Study Conclusions  - Left ventricle: The cavity size was normal. There was moderate   concentric hypertrophy. Systolic function was normal. The   estimated ejection fraction was in the range of 55% to 60%. Wall   motion was normal; there were no regional wall motion   abnormalities. Doppler parameters are consistent with abnormal   left ventricular relaxation (grade 1 diastolic dysfunction).   There was no evidence of elevated ventricular filling pressure by   Doppler parameters. - Aortic valve: Trileaflet; normal thickness leaflets. There was no   regurgitation. - Aortic root: The aortic root was normal in size. - Mitral valve: Structurally normal valve. There was mild   regurgitation. - Left atrium: The atrium was normal in size. - Right ventricle: The cavity size was normal. Wall thickness was   normal. Systolic function was normal. - Right atrium: The atrium was normal in size. - Tricuspid valve: There was moderate regurgitation. - Pulmonary arteries: Systolic pressure was moderately increased.   PA peak pressure: 43 mm Hg (S). - Inferior vena cava: The vessel was dilated. The respirophasic   diameter changes were blunted (< 50%),  consistent with elevated   central venous pressure. - Pericardium, extracardiac: There was no pericardial effusion.  Recent Labs: 04/27/2016: BUN 12; Creatinine, Ser 0.91; Hemoglobin 13.8; Platelets 188; Potassium 3.3; Sodium 138    Lipid Panel    Component Value Date/Time   CHOL 221 (H) 03/29/2010 2024   TRIG 176 (H) 03/29/2010 2024   HDL 40 03/29/2010 2024   CHOLHDL 5.5 Ratio 03/29/2010 2024   VLDL 35 03/29/2010 2024   LDLCALC 146 (H) 03/29/2010 2024      Wt Readings from Last 3 Encounters:  10/18/16 57.2 kg (126 lb 3.2 oz)  09/28/16 59.9 kg (132 lb)  09/17/16 60 kg (132 lb 3.2 oz)     ASSESSMENT AND PLAN:  # CAD: # Angina: Ms. Welge had mild-moderate obstruction on cardiac CT-A 03/2014.  Her symptoms are atypical but she does have some exertional symptoms.  Lexiscan Myoview was negative for ischemia.  However she is still very concerned that this could be her heart. She reports having a left heart catheterization in the past at which time she was told that she needed a stent but declined. This was prior to her CT-A with Dr. Moshe Cipro in 2015, at which time she was noted to have moderate LAD disease.  Given that she continues to have chest pain that is relieved only with nitroglycerin and rest, we will plan for left heart catheterization to definitively assess her coronaries. Reduce aspirin to 81 mg daily.  Continue simvastatin. She is taking blood pressure medication but does not know what they are. She'll call us with these medications.  She has been losing weight without trying. She is scheduled for colonoscopy June 12. Therefore we will wait until after that to have her heart catheterization.   # Tobacco abuse: Ms. Rothschild was again  encouraged to continue smoking cessation.  Continue nicotine patches.   # Hypertension: Blood pressure is above goal. She is unsure what blood pressure medication she is taking. She will call with these medications before making any changes.   #  Hyperlipidemia: Check fasting lipids and CMP.  Continue simvastatin.     Current medicines  are reviewed at length with the patient today.  The patient does not have concerns regarding medicines.  The following changes have been made:  Reduce aspirin to 81 mg daily   Labs/ tests ordered today include:   Orders Placed This Encounter  Procedures  . CBC  . Basic Metabolic Panel (BMET)  . TSH  . INR/PT  . APTT  . EKG 12-Lead     Disposition:   FU with Arlynn Stare C. Oval Linsey, MD, [facility] in 1 month   This note was written with the assistance of speech recognition software.  Please excuse any transcriptional errors.  Signed, Shady Bradish C. Oval Linsey, MD, [facility]  10/18/2016 12:52 PM    [facility]

## 2016-10-19 ENCOUNTER — Telehealth: Payer: Self-pay | Admitting: Cardiovascular Disease

## 2016-10-19 NOTE — Telephone Encounter (Signed)
Patient calling, states that she was instructed to call back with a list of her medications.  Patient also calling, states that she is scheduled for a cath but was told to have a "colon test" done first by our office, however the colon specialist requests that patient have heart cath before the "colon test." Please call to discuss, thanks.

## 2016-10-19 NOTE — Telephone Encounter (Signed)
Left message to call back  

## 2016-10-19 NOTE — Telephone Encounter (Signed)
OK.  If she needs a stent the colonoscopy will need to be delayed at least 6-12 months.  Needs a CBC before cath.

## 2016-10-19 NOTE — Telephone Encounter (Signed)
Returned call to patient-wanted to report medications that she takes that are currently not on her med list.    Diltiazem 300mg  daily  Duloxetine 60mg  daily.  Also reports she was suppose to have colonoscopy on June 12th prior to heart cath on June 18th  (noted by Dr. Oval Linsey) and the GI doctor will not do the procedure until she has the cath. Patient understands and ok with having colonoscopy after as this was a routine procedure.    Advised I would route to Dr. Oval Linsey to make aware of medications.  Patient aware and verbalized understanding.

## 2016-11-05 DIAGNOSIS — F4310 Post-traumatic stress disorder, unspecified: Secondary | ICD-10-CM | POA: Diagnosis not present

## 2016-11-07 ENCOUNTER — Other Ambulatory Visit: Payer: Self-pay | Admitting: *Deleted

## 2016-11-07 DIAGNOSIS — I200 Unstable angina: Secondary | ICD-10-CM

## 2016-11-08 ENCOUNTER — Encounter: Payer: Medicare HMO | Admitting: Gastroenterology

## 2016-11-08 ENCOUNTER — Telehealth: Payer: Self-pay

## 2016-11-08 NOTE — Telephone Encounter (Signed)
Call placed to Pt to provide cath instruction.   Call went to VM.  Left generic message requesting call back to this nurse if Pt has any questions.  Name and # left.

## 2016-11-09 DIAGNOSIS — Z01812 Encounter for preprocedural laboratory examination: Secondary | ICD-10-CM | POA: Diagnosis not present

## 2016-11-09 DIAGNOSIS — D689 Coagulation defect, unspecified: Secondary | ICD-10-CM | POA: Diagnosis not present

## 2016-11-09 LAB — BASIC METABOLIC PANEL
BUN / CREAT RATIO: 19 (ref 9–23)
BUN: 16 mg/dL (ref 6–24)
CALCIUM: 10.1 mg/dL (ref 8.7–10.2)
CHLORIDE: 105 mmol/L (ref 96–106)
CO2: 26 mmol/L (ref 20–29)
Creatinine, Ser: 0.86 mg/dL (ref 0.57–1.00)
GFR calc Af Amer: 87 mL/min/{1.73_m2} (ref >59)
GFR calc non Af Amer: 76 mL/min/{1.73_m2} (ref >59)
Glucose: 91 mg/dL (ref 65–99)
Potassium: 4.3 mmol/L (ref 3.5–5.2)
Sodium: 137 mmol/L (ref 134–144)

## 2016-11-09 LAB — CBC
Hematocrit: 36.8 % (ref 34.0–46.6)
Hemoglobin: 12.6 g/dL (ref 11.1–15.9)
MCH: 26.8 pg (ref 26.6–33.0)
MCHC: 34.2 g/dL (ref 31.5–35.7)
MCV: 78 fL — ABNORMAL LOW (ref 79–97)
PLATELETS: 112 10*3/uL — AB (ref 150–379)
RBC: 4.7 x10E6/uL (ref 3.77–5.28)
RDW: 16.8 % — ABNORMAL HIGH (ref 12.3–15.4)
WBC: 7.3 10*3/uL (ref 3.4–10.8)

## 2016-11-09 LAB — PROTIME-INR
INR: 1 (ref 0.8–1.2)
Prothrombin Time: 10.5 s (ref 9.1–12.0)

## 2016-11-09 LAB — APTT: aPTT: 28 s (ref 24–33)

## 2016-11-12 ENCOUNTER — Encounter (HOSPITAL_COMMUNITY): Payer: Self-pay | Admitting: Cardiology

## 2016-11-12 ENCOUNTER — Encounter (HOSPITAL_COMMUNITY)
Admission: RE | Disposition: A | Discharge status: Home / Self Care | Payer: Self-pay | Source: Ambulatory Visit | Attending: Cardiology

## 2016-11-12 ENCOUNTER — Ambulatory Visit (HOSPITAL_COMMUNITY)
Admission: RE | Admit: 2016-11-12 | Discharge: 2016-11-12 | Disposition: A | Discharge status: Home / Self Care | Payer: PPO | Source: Ambulatory Visit | Attending: Cardiology | Admitting: Cardiology

## 2016-11-12 DIAGNOSIS — I10 Essential (primary) hypertension: Secondary | ICD-10-CM | POA: Diagnosis present

## 2016-11-12 DIAGNOSIS — F4310 Post-traumatic stress disorder, unspecified: Secondary | ICD-10-CM | POA: Diagnosis not present

## 2016-11-12 DIAGNOSIS — Z880 Allergy status to penicillin: Secondary | ICD-10-CM | POA: Insufficient documentation

## 2016-11-12 DIAGNOSIS — K219 Gastro-esophageal reflux disease without esophagitis: Secondary | ICD-10-CM | POA: Diagnosis not present

## 2016-11-12 DIAGNOSIS — E785 Hyperlipidemia, unspecified: Secondary | ICD-10-CM | POA: Insufficient documentation

## 2016-11-12 DIAGNOSIS — Z8673 Personal history of transient ischemic attack (TIA), and cerebral infarction without residual deficits: Secondary | ICD-10-CM | POA: Diagnosis not present

## 2016-11-12 DIAGNOSIS — Z91040 Latex allergy status: Secondary | ICD-10-CM | POA: Diagnosis not present

## 2016-11-12 DIAGNOSIS — Z8249 Family history of ischemic heart disease and other diseases of the circulatory system: Secondary | ICD-10-CM | POA: Insufficient documentation

## 2016-11-12 DIAGNOSIS — I200 Unstable angina: Secondary | ICD-10-CM

## 2016-11-12 DIAGNOSIS — Z90710 Acquired absence of both cervix and uterus: Secondary | ICD-10-CM | POA: Insufficient documentation

## 2016-11-12 DIAGNOSIS — Z8042 Family history of malignant neoplasm of prostate: Secondary | ICD-10-CM | POA: Diagnosis not present

## 2016-11-12 DIAGNOSIS — F319 Bipolar disorder, unspecified: Secondary | ICD-10-CM | POA: Insufficient documentation

## 2016-11-12 DIAGNOSIS — I25118 Atherosclerotic heart disease of native coronary artery with other forms of angina pectoris: Secondary | ICD-10-CM | POA: Diagnosis not present

## 2016-11-12 DIAGNOSIS — F17210 Nicotine dependence, cigarettes, uncomplicated: Secondary | ICD-10-CM | POA: Insufficient documentation

## 2016-11-12 DIAGNOSIS — Z79899 Other long term (current) drug therapy: Secondary | ICD-10-CM | POA: Diagnosis not present

## 2016-11-12 DIAGNOSIS — E7800 Pure hypercholesterolemia, unspecified: Secondary | ICD-10-CM | POA: Diagnosis present

## 2016-11-12 DIAGNOSIS — R079 Chest pain, unspecified: Secondary | ICD-10-CM | POA: Diagnosis present

## 2016-11-12 DIAGNOSIS — F17200 Nicotine dependence, unspecified, uncomplicated: Secondary | ICD-10-CM | POA: Diagnosis present

## 2016-11-12 DIAGNOSIS — I208 Other forms of angina pectoris: Secondary | ICD-10-CM | POA: Diagnosis not present

## 2016-11-12 DIAGNOSIS — Z7982 Long term (current) use of aspirin: Secondary | ICD-10-CM | POA: Diagnosis not present

## 2016-11-12 DIAGNOSIS — Z833 Family history of diabetes mellitus: Secondary | ICD-10-CM | POA: Diagnosis not present

## 2016-11-12 HISTORY — PX: LEFT HEART CATH AND CORONARY ANGIOGRAPHY: CATH118249

## 2016-11-12 HISTORY — PX: INTRAVASCULAR PRESSURE WIRE/FFR STUDY: CATH118243

## 2016-11-12 LAB — POCT ACTIVATED CLOTTING TIME: ACTIVATED CLOTTING TIME: 296 s

## 2016-11-12 LAB — TSH: TSH: 1.12 u[IU]/mL (ref 0.450–4.500)

## 2016-11-12 SURGERY — LEFT HEART CATH AND CORONARY ANGIOGRAPHY
Anesthesia: LOCAL

## 2016-11-12 MED ORDER — HEPARIN SODIUM (PORCINE) 1000 UNIT/ML IJ SOLN
INTRAMUSCULAR | Status: DC | PRN
Start: 2016-11-12 — End: 2016-11-12
  Administered 2016-11-12 (×2): 3000 [IU] via INTRAVENOUS

## 2016-11-12 MED ORDER — HEPARIN (PORCINE) IN NACL 2-0.9 UNIT/ML-% IJ SOLN
INTRAMUSCULAR | Status: DC | PRN
Start: 2016-11-12 — End: 2016-11-12
  Administered 2016-11-12: 08:00:00

## 2016-11-12 MED ORDER — HEPARIN SODIUM (PORCINE) 1000 UNIT/ML IJ SOLN
INTRAMUSCULAR | Status: AC
  Filled 2016-11-12: qty 1

## 2016-11-12 MED ORDER — SODIUM CHLORIDE 0.9 % IV SOLN: 250.0000 mL | INTRAVENOUS | Status: DC | PRN

## 2016-11-12 MED ORDER — FENTANYL CITRATE (PF) 100 MCG/2ML IJ SOLN
INTRAMUSCULAR | Status: DC | PRN
  Administered 2016-11-12: 25 ug via INTRAVENOUS

## 2016-11-12 MED ORDER — IOPAMIDOL (ISOVUE-370) INJECTION 76%
INTRAVENOUS | Status: AC
  Filled 2016-11-12: qty 100

## 2016-11-12 MED ORDER — IOPAMIDOL (ISOVUE-370) INJECTION 76%
INTRAVENOUS | Status: AC
  Filled 2016-11-12: qty 50

## 2016-11-12 MED ORDER — VERAPAMIL HCL 2.5 MG/ML IV SOLN
INTRAVENOUS | Status: AC
  Filled 2016-11-12: qty 2

## 2016-11-12 MED ORDER — SODIUM CHLORIDE 0.9% FLUSH: 3.0000 mL | INTRAVENOUS | Status: DC | PRN

## 2016-11-12 MED ORDER — SODIUM CHLORIDE 0.9% FLUSH: 3.0000 mL | Freq: Two times a day (BID) | INTRAVENOUS | Status: DC

## 2016-11-12 MED ORDER — MIDAZOLAM HCL 2 MG/2ML IJ SOLN
INTRAMUSCULAR | Status: AC
  Filled 2016-11-12: qty 2

## 2016-11-12 MED ORDER — VERAPAMIL HCL 2.5 MG/ML IV SOLN
INTRAVENOUS | Status: DC | PRN
  Administered 2016-11-12: 10 mL via INTRA_ARTERIAL

## 2016-11-12 MED ORDER — HEPARIN (PORCINE) IN NACL 2-0.9 UNIT/ML-% IJ SOLN
INTRAMUSCULAR | Status: AC
  Filled 2016-11-12: qty 500

## 2016-11-12 MED ORDER — MIDAZOLAM HCL 2 MG/2ML IJ SOLN
INTRAMUSCULAR | Status: DC | PRN
Start: 2016-11-12 — End: 2016-11-12
  Administered 2016-11-12: 1 mg via INTRAVENOUS

## 2016-11-12 MED ORDER — SODIUM CHLORIDE 0.9 % IV SOLN
INTRAVENOUS | Status: DC
Start: 2016-11-12 — End: 2016-11-12
  Administered 2016-11-12: 06:00:00 via INTRAVENOUS

## 2016-11-12 MED ORDER — SODIUM CHLORIDE 0.9% FLUSH
3.0000 mL | Freq: Two times a day (BID) | INTRAVENOUS | Status: DC
Start: 2016-11-12 — End: 2016-11-12

## 2016-11-12 MED ORDER — ADENOSINE 12 MG/4ML IV SOLN
INTRAVENOUS | Status: AC
  Filled 2016-11-12: qty 16

## 2016-11-12 MED ORDER — LIDOCAINE HCL (PF) 1 % IJ SOLN
INTRAMUSCULAR | Status: DC | PRN
  Administered 2016-11-12: 2 mL

## 2016-11-12 MED ORDER — ASPIRIN 81 MG PO CHEW
81.0000 mg | CHEWABLE_TABLET | ORAL | Status: AC
  Administered 2016-11-12: 81 mg via ORAL

## 2016-11-12 MED ORDER — SODIUM CHLORIDE 0.9 % WEIGHT BASED INFUSION: 1.0000 mL/kg/h | INTRAVENOUS | Status: AC

## 2016-11-12 MED ORDER — IOPAMIDOL (ISOVUE-370) INJECTION 76%
INTRAVENOUS | Status: DC | PRN
  Administered 2016-11-12: 120 mL

## 2016-11-12 MED ORDER — NITROGLYCERIN 1 MG/10 ML FOR IR/CATH LAB
INTRA_ARTERIAL | Status: DC | PRN
  Administered 2016-11-12: 200 ug via INTRACORONARY

## 2016-11-12 MED ORDER — ASPIRIN 81 MG PO CHEW
CHEWABLE_TABLET | ORAL | Status: AC
  Filled 2016-11-12: qty 1

## 2016-11-12 MED ORDER — HEPARIN (PORCINE) IN NACL 2-0.9 UNIT/ML-% IJ SOLN
INTRAMUSCULAR | Status: AC
Start: 2016-11-12 — End: ?
  Filled 2016-11-12: qty 500

## 2016-11-12 MED ORDER — ADENOSINE 12 MG/4ML IV SOLN
INTRAVENOUS | Status: AC
  Filled 2016-11-12: qty 4

## 2016-11-12 MED ORDER — FENTANYL CITRATE (PF) 100 MCG/2ML IJ SOLN
INTRAMUSCULAR | Status: AC
Start: 2016-11-12 — End: ?
  Filled 2016-11-12: qty 2

## 2016-11-12 MED ORDER — NITROGLYCERIN 1 MG/10 ML FOR IR/CATH LAB
INTRA_ARTERIAL | Status: AC
  Filled 2016-11-12: qty 10

## 2016-11-12 SURGICAL SUPPLY — 15 items
CATH 5FR JL3.5 JR4 ANG PIG MP (CATHETERS) ×2 IMPLANT
CATH INFINITI 5 FR 3DRC (CATHETERS) ×2 IMPLANT
CATH INFINITI 5FR AL1 (CATHETERS) ×2 IMPLANT
CATH LAUNCHER 6FR EBU 3 (CATHETERS) ×2 IMPLANT
DEVICE RAD COMP TR BAND LRG (VASCULAR PRODUCTS) IMPLANT
DEVICE RAD TR BAND REGULAR (VASCULAR PRODUCTS) ×2 IMPLANT
GLIDESHEATH SLEND SS 6F .021 (SHEATH) ×2 IMPLANT
GUIDEWIRE INQWIRE 1.5J.035X260 (WIRE) ×1 IMPLANT
GUIDEWIRE PRESSURE COMET II (WIRE) ×2 IMPLANT
INQWIRE 1.5J .035X260CM (WIRE) ×2
KIT ESSENTIALS PG (KITS) ×2 IMPLANT
KIT HEART LEFT (KITS) ×2 IMPLANT
PACK CARDIAC CATHETERIZATION (CUSTOM PROCEDURE TRAY) ×2 IMPLANT
TRANSDUCER W/STOPCOCK (MISCELLANEOUS) ×2 IMPLANT
TUBING CIL FLEX 10 FLL-RA (TUBING) ×2 IMPLANT

## 2016-11-12 NOTE — H&P (View-Only) (Signed)
Cardiology Office Note   Date:  10/18/2016   ID:  [name], DOB Jun 28, 1960, MRN [id number]  PCP:  Rogers Blocker, MD  Cardiologist:   Skeet Latch, MD   Chief Complaint  Patient presents with  . Follow-up    Chest pain      History of Present Illness: [name] is a 56 y.o. female with hypertension, prior stroke, tobacco abuse, and bipolar disorder, who presents for follow up on chest pain.  [name] was seen 08/2016 with chest pain.  She last saw Dr. Kevan Ny on 08/29/16 and reported persistent, dull chest pain.  She was referred to cardiology for further evaluation. [name] saw Dr. Johnsie Cancel in 2011 for atypical chest pain and palpitations.  Her symptoms were felt to be consistent with panic attacks. She had a stress test in 2007 that was reportedly negative for ischemia.  She had a cardiac CT angiography 03/2014 that revealed 0-25% left main stenosis and 25-50% proximal LAD stenosis.  Her symptoms orthopnea consistent with anxiety, but given her history she was referred for [facility] 09/28/16 that was negative for ischemia.it revealed LVEF47% so she was referred for an echocardiogram 10/08/16 that revealedLVEF 55-60% with grade 1 diastolic dysfunction.  [name] continues to have chest pain.  She reports chest pain that occurs when laying down.  It is so severe that she can't move.  She had another episode while in the kitchen trying to cook and she was unable to move.  She takes nitroglycerin which alleviates the pain.  She has been taking up to 5 tablets at a time before she gets relief.  The pain occurs approximately every other day and lasts for several minutes.  She denies lower extremity edema, orhthopnea or PND. She is very concerned because her younger brother had a CABG and recently had another heart attacked.  Her mother had a defibrillator prior to her death.  She continues to smoke 1-2 cigarettes daily.   Past Medical History:  Diagnosis Date  . Bipolar 1  disorder ([place])   . Cancer [facility])    endometrial  . Chest pain   . CVA (cerebrovascular accident) ([place]) 2007  . Depression   . GERD (gastroesophageal reflux disease)   . Hypertension   . PTSD (post-traumatic stress disorder)   . Thyroid disease     Past Surgical History:  Procedure Laterality Date  . ABDOMINAL HYSTERECTOMY  07/2009  . SALPINGOOPHORECTOMY    . TUBAL LIGATION       Current Outpatient Prescriptions  Medication Sig Dispense Refill  . albuterol (PROVENTIL HFA;VENTOLIN HFA) 108 (90 BASE) MCG/ACT inhaler Inhale 2 puffs into the lungs every 6 (six) hours as needed for wheezing or shortness of breath.     Marland Kitchen aspirin EC 81 MG tablet Take 81 mg by mouth daily.    . beclomethasone (QVAR) 80 MCG/ACT inhaler Inhale 1 puff into the lungs 2 (two) times daily as needed (shortness of breath).     . busPIRone (BUSPAR) 5 MG tablet Take 5 mg by mouth 2 (two) times daily.    . ferrous gluconate (FERGON) 324 MG tablet Take 324 mg by mouth daily with breakfast.    . gabapentin (NEURONTIN) 600 MG tablet Take 600 mg by mouth 3 (three) times daily.    . hydrOXYzine (ATARAX/VISTARIL) 50 MG tablet Take 50-100 mg by mouth at bedtime as needed for anxiety.     . lamoTRIgine (LAMICTAL) 200 MG tablet Take 200 mg  by mouth every evening.     . Lurasidone HCl (LATUDA) 60 MG TABS Take 1 tablet by mouth every evening.    . meloxicam (MOBIC) 7.5 MG tablet Take 1 tablet (7.5 mg total) by mouth daily. 15 tablet 0  . methocarbamol (ROBAXIN) 500 MG tablet Take 500 mg by mouth every 6 (six) hours as needed for muscle spasms.    . nitroGLYCERIN (NITRODUR - DOSED IN MG/24 HR) 0.2 mg/hr patch Place 0.2 mg onto the skin daily.    . nitroGLYCERIN (NITROSTAT) 0.4 MG SL tablet Place 0.4 mg under the tongue every 5 (five) minutes as needed for chest pain.    Marland Kitchen omeprazole (PRILOSEC) 40 MG capsule Take 40 mg by mouth every morning.     Marland Kitchen oxyCODONE-acetaminophen (PERCOCET) 5-325 MG tablet Take 1 tablet by mouth every 4  (four) hours as needed for moderate pain. 15 tablet 0  . QUEtiapine (SEROQUEL) 100 MG tablet Take 100 mg by mouth at bedtime.    . simvastatin (ZOCOR) 40 MG tablet Take 40 mg by mouth every evening.    . topiramate (TOPAMAX) 50 MG tablet Take 50 mg by mouth 2 (two) times daily as needed (migraines).      No current facility-administered medications for this visit.     Allergies:   Other; Latex; and Penicillins    Social History:  The patient  reports that she has been smoking Cigarettes.  She has been smoking about 0.25 packs per day. She has never used smokeless tobacco. She reports that she does not drink alcohol or use drugs.   Family History:  [name] family history includes CAD in her brother and sister; Cancer in her maternal aunt; Diabetes in her mother; Heart disease in her mother; Hypertension in her mother; Prostate cancer in her other.    ROS:  Please see the history of present illness.   Otherwise, review of systems are positive for none.   All other systems are reviewed and negative.    PHYSICAL EXAM: VS:  BP (!) 148/86   Pulse 75   Ht 5' 3.5" (1.613 m)   Wt 57.2 kg (126 lb 3.2 oz)   BMI 22.00 kg/m  , BMI Body mass index is 22 kg/m. GENERAL:  Well appearing.  No acute distress. HEENT:  Pupils equal round and reactive, fundi not visualized, oral mucosa unremarkable NECK:  No jugular venous distention, waveform within normal limits, carotid upstroke brisk and symmetric, no bruits LUNGS:  Clear to auscultation bilaterally. No crackles or rhonchi.  Mild, scattered wheezes HEART:  RRR.  PMI not displaced or sustained,S1 and S2 within normal limits, no S3, no S4, no clicks, no rubs, no murmurs ABD:  Flat, positive bowel sounds normal in frequency in pitch, no bruits, no rebound, no guarding, no midline pulsatile mass, no hepatomegaly, no splenomegaly EXT:  2 plus pulses throughout, no edema, no cyanosis no clubbing SKIN:  No rashes no nodules NEURO:  Cranial nerves II  through XII grossly intact, motor grossly intact throughout PSYCH:  Cognitively intact, oriented to person place and time   EKG:  EKG is ordered today. The ekg ordered today demonstrates sinus rhythm.  Rate 83 bpm.  LVH with repolarizationa bnormalities.   10/18/16: Sinus rhythm.  Rate 75 bpm.  LVH with repolarization abnormalities.  Anterolateral TWI more pronounced than prior.  Cardiac CT-A 04/12/14: 25-50% LAD, 0-25% LM, 0-25% OM.  Coronary calcium score 98 %ile.  Lexiscan Myoview 09/28/16:  The left ventricular ejection fraction is  mildly decreased (45-54%).  Nuclear stress EF: 47%.  No T wave inversion was noted during stress.  There was no ST segment deviation noted during stress.  This is an intermediate risk study.  Echo 10/08/16: Study Conclusions  - Left ventricle: The cavity size was normal. There was moderate   concentric hypertrophy. Systolic function was normal. The   estimated ejection fraction was in the range of 55% to 60%. Wall   motion was normal; there were no regional wall motion   abnormalities. Doppler parameters are consistent with abnormal   left ventricular relaxation (grade 1 diastolic dysfunction).   There was no evidence of elevated ventricular filling pressure by   Doppler parameters. - Aortic valve: Trileaflet; normal thickness leaflets. There was no   regurgitation. - Aortic root: The aortic root was normal in size. - Mitral valve: Structurally normal valve. There was mild   regurgitation. - Left atrium: The atrium was normal in size. - Right ventricle: The cavity size was normal. Wall thickness was   normal. Systolic function was normal. - Right atrium: The atrium was normal in size. - Tricuspid valve: There was moderate regurgitation. - Pulmonary arteries: Systolic pressure was moderately increased.   PA peak pressure: 43 mm Hg (S). - Inferior vena cava: The vessel was dilated. The respirophasic   diameter changes were blunted (< 50%),  consistent with elevated   central venous pressure. - Pericardium, extracardiac: There was no pericardial effusion.  Recent Labs: 04/27/2016: BUN 12; Creatinine, Ser 0.91; Hemoglobin 13.8; Platelets 188; Potassium 3.3; Sodium 138    Lipid Panel    Component Value Date/Time   CHOL 221 (H) 03/29/2010 2024   TRIG 176 (H) 03/29/2010 2024   HDL 40 03/29/2010 2024   CHOLHDL 5.5 Ratio 03/29/2010 2024   VLDL 35 03/29/2010 2024   LDLCALC 146 (H) 03/29/2010 2024      Wt Readings from Last 3 Encounters:  10/18/16 57.2 kg (126 lb 3.2 oz)  09/28/16 59.9 kg (132 lb)  09/17/16 60 kg (132 lb 3.2 oz)     ASSESSMENT AND PLAN:  # CAD: # Angina: [name] had mild-moderate obstruction on cardiac CT-A 03/2014.  Her symptoms are atypical but she does have some exertional symptoms.  Lexiscan Myoview was negative for ischemia.  However she is still very concerned that this could be her heart. She reports having a left heart catheterization in the past at which time she was told that she needed a stent but declined. This was prior to her CT-A with Dr. Moshe Cipro in 2015, at which time she was noted to have moderate LAD disease.  Given that she continues to have chest pain that is relieved only with nitroglycerin and rest, we will plan for left heart catheterization to definitively assess her coronaries. Reduce aspirin to 81 mg daily.  Continue simvastatin. She is taking blood pressure medication but does not know what they are. She'll call us with these medications.  She has been losing weight without trying. She is scheduled for colonoscopy June 12. Therefore we will wait until after that to have her heart catheterization.   # Tobacco abuse: [name] was again  encouraged to continue smoking cessation.  Continue nicotine patches.   # Hypertension: Blood pressure is above goal. She is unsure what blood pressure medication she is taking. She will call with these medications before making any changes.   #  Hyperlipidemia: Check fasting lipids and CMP.  Continue simvastatin.     Current medicines  are reviewed at length with the patient today.  The patient does not have concerns regarding medicines.  The following changes have been made:  Reduce aspirin to 81 mg daily   Labs/ tests ordered today include:   Orders Placed This Encounter  Procedures  . CBC  . Basic Metabolic Panel (BMET)  . TSH  . INR/PT  . APTT  . EKG 12-Lead     Disposition:   FU with Karina Carp C. Oval Linsey, MD, [facility] in 1 month   This note was written with the assistance of speech recognition software.  Please excuse any transcriptional errors.  Signed, Jyquan Kenley C. Oval Linsey, MD, [facility]  10/18/2016 12:52 PM    [facility]

## 2016-11-12 NOTE — Interval H&P Note (Signed)
History and Physical Interval Note:  11/12/2016 7:29 AM  [name]  has presented today for surgery, with the diagnosis of angina  The various methods of treatment have been discussed with the patient and family. After consideration of risks, benefits and other options for treatment, the patient has consented to  Procedure(s): Left Heart Cath and Coronary Angiography (N/A) as a surgical intervention .  [name] history has been reviewed, patient examined, no change in status, stable for surgery.  I have reviewed the patient's chart and labs.  Questions were answered to the patient's satisfaction.    Cath Lab Visit (complete for each Cath Lab visit)  Clinical Evaluation Leading to the Procedure:   ACS: No.  Non-ACS:    Anginal Classification: CCS III  Anti-ischemic medical therapy: No Therapy  Non-Invasive Test Results: Low-risk stress test findings: cardiac mortality <1%/year  Prior CABG: No previous CABG       [name] 11/12/2016 7:29 AM

## 2016-11-12 NOTE — Telephone Encounter (Signed)
Patient had cath today

## 2016-11-12 NOTE — Discharge Instructions (Signed)

## 2016-11-13 ENCOUNTER — Other Ambulatory Visit: Payer: Self-pay

## 2016-11-13 ENCOUNTER — Encounter: Payer: Self-pay | Admitting: Thoracic Surgery (Cardiothoracic Vascular Surgery)

## 2016-11-13 ENCOUNTER — Institutional Professional Consult (permissible substitution) (INDEPENDENT_AMBULATORY_CARE_PROVIDER_SITE_OTHER): Payer: PPO | Admitting: Thoracic Surgery (Cardiothoracic Vascular Surgery)

## 2016-11-13 VITALS — BP 158/88 | HR 84 | Resp 20 | Ht 63.5 in | Wt 129.0 lb

## 2016-11-13 DIAGNOSIS — I2510 Atherosclerotic heart disease of native coronary artery without angina pectoris: Secondary | ICD-10-CM

## 2016-11-13 NOTE — Progress Notes (Signed)
PCP is Rogers Blocker, MD Referring Provider is Martinique, Peter M, MD  Chief Complaint  Patient presents with  . Coronary Artery Disease    Surgical eval, Cardiac Cath 11/12/16, ECHO 10/08/2016    HPI: [name] is sent for consultation for possible coronary bypass grafting.  [name] is a 56 year old woman with a history of bipolar disorder, postemetic stress disorder, depression, CVA in 2007 with no residual, endometrial cancer in 2010, reflux and hypothyroidism. [name] has no prior cardiac history although [name] was worked up for chest pain back in 2011.  [name] says [name]'s been having chest pain for about the past year. On the first occasion it awakened [name] from [name] sleep. [name] was evaluated by Dr. Terrence Dupont.   Apparently wanted to do an invasive procedure but there was some issue with [name] insurance and [name] was treated medically. [name] says [name] got better initially.  [name] started having pain again this winter. [name] saw Dr. Marlou Sa and was referred to Dr. Oval Linsey. [name] complained of chest pain that often occur when [name] laid down to sleep. [name] describes this is a pressure and pain at the same time and often takes [name] breath away. [name] is also had this with exertion sometimes very minimal exertion such as walking from the bedroom to the kitchen. [name] says when it occurs [name] has to hold very still until it resolves. [name]'s taken as many as 5 consecutive nitroglycerin before experiencing pain relief.   [name] does smoke, but says [name]'s never smoked more than one half pack per day and was down to a few cigarettes a day or catheterization. [name] says [name] quit after [name] catheterization yesterday.  Past Medical History:  Diagnosis Date  . Bipolar 1 disorder ([place])   . Cancer [facility])    endometrial  . Chest pain   . CVA (cerebrovascular accident) ([place]) 2007  . Depression   . GERD (gastroesophageal reflux disease)   . Hypertension   . PTSD (post-traumatic stress disorder)   . Thyroid disease     Past Surgical History:   Procedure Laterality Date  . ABDOMINAL HYSTERECTOMY  07/2009  . INTRAVASCULAR PRESSURE WIRE/FFR STUDY N/A 11/12/2016   Procedure: Intravascular Pressure Wire/FFR Study;  Surgeon: Martinique, Peter M, MD;  Location: [place] [facility];  Service: Cardiovascular;  Laterality: N/A;  . LEFT HEART CATH AND CORONARY ANGIOGRAPHY N/A 11/12/2016   Procedure: Left Heart Cath and Coronary Angiography;  Surgeon: Martinique, Peter M, MD;  Location: [place] [facility];  Service: Cardiovascular;  Laterality: N/A;  . SALPINGOOPHORECTOMY    . TUBAL LIGATION      Family History  Problem Relation Age of Onset  . Hypertension Mother   . Heart disease Mother   . Diabetes Mother   . Prostate cancer Other   . Cancer Maternal Aunt   . CAD Sister   . CAD Brother     Social History Social History  Substance Use Topics  . Smoking status: Current Some Day Smoker    Packs/day: 0.25    Types: Cigarettes  . Smokeless tobacco: Never Used     Comment: trying to stop smoking   . Alcohol use No    Current Outpatient Prescriptions  Medication Sig Dispense Refill  . albuterol (PROVENTIL HFA;VENTOLIN HFA) 108 (90 BASE) MCG/ACT inhaler Inhale 2 puffs into the lungs every 6 (six) hours as needed for wheezing or shortness of breath.     Marland Kitchen aspirin EC 81 MG tablet Take 81 mg by mouth daily.    Marland Kitchen  beclomethasone (QVAR) 80 MCG/ACT inhaler Inhale 1 puff into the lungs 2 (two) times daily as needed (shortness of breath).     . busPIRone (BUSPAR) 5 MG tablet Take 5 mg by mouth 2 (two) times daily.    . Cholecalciferol (VITAMIN D3 PO) Take 1 capsule by mouth daily.    Marland Kitchen diltiazem (TIAZAC) 300 MG 24 hr capsule Take 300 mg by mouth daily.    . DULoxetine (CYMBALTA) 60 MG capsule Take 60 mg by mouth daily.    . ferrous gluconate (FERGON) 324 MG tablet Take 324 mg by mouth daily with breakfast.    . gabapentin (NEURONTIN) 600 MG tablet Take 600 mg by mouth 3 (three) times daily.    Marland Kitchen lamoTRIgine (LAMICTAL) 200 MG tablet Take 200 mg by  mouth every evening.     . Lurasidone HCl (LATUDA) 60 MG TABS Take 1 tablet by mouth every evening.    . meloxicam (MOBIC) 7.5 MG tablet Take 1 tablet (7.5 mg total) by mouth daily. 15 tablet 0  . methocarbamol (ROBAXIN) 500 MG tablet Take 500 mg by mouth every 6 (six) hours as needed for muscle spasms.    . nitroGLYCERIN (NITROSTAT) 0.4 MG SL tablet Place 0.4 mg under the tongue every 5 (five) minutes as needed for chest pain.    Marland Kitchen omeprazole (PRILOSEC) 40 MG capsule Take 40 mg by mouth every morning.     Marland Kitchen oxyCODONE-acetaminophen (PERCOCET) 5-325 MG tablet Take 1 tablet by mouth every 4 (four) hours as needed for moderate pain. 15 tablet 0  . QUEtiapine (SEROQUEL) 100 MG tablet Take 100 mg by mouth at bedtime.    . simvastatin (ZOCOR) 40 MG tablet Take 40 mg by mouth every evening.    . topiramate (TOPAMAX) 50 MG tablet Take 50 mg by mouth 2 (two) times daily as needed (migraines).      No current facility-administered medications for this visit.     Allergies  Allergen Reactions  . Other Anaphylaxis    Fire ants  . Latex Rash  . Penicillins Itching and Rash    Has patient had a PCN reaction causing immediate rash, facial/tongue/throat swelling, SOB or lightheadedness with hypotension: Yes Has patient had a PCN reaction causing severe rash involving mucus membranes or skin necrosis: No  Has patient had a PCN reaction that required hospitalization No Has patient had a PCN reaction occurring within the last 10 years: No If all of the above answers are "NO", then may proceed with Cephalosporin use.     Review of Systems  Constitutional: Positive for activity change and fatigue. Negative for appetite change, chills, fever and unexpected weight change.  HENT: Negative for trouble swallowing and voice change.   Eyes: Negative for visual disturbance.  Respiratory: Positive for shortness of breath. Negative for wheezing.   Cardiovascular: Positive for chest pain. Negative for palpitations  and leg swelling.  Gastrointestinal: Negative for abdominal distention, abdominal pain and blood in stool.  Genitourinary: Negative for difficulty urinating and pelvic pain.  Musculoskeletal: Positive for arthralgias, back pain, myalgias (right calf pain with walking ? claudication) and neck pain.  Neurological: Negative for dizziness, seizures, syncope, speech difficulty and weakness.       CVA 2007 R hand- no residual  Hematological: Negative for adenopathy. Does not bruise/bleed easily.  Psychiatric/Behavioral: Positive for dysphoric mood. The patient is nervous/anxious.        Bipolar, PTSD, depression  All other systems reviewed and are negative.   BP (!) 158/88  Pulse 84   Resp 20   Ht 5' 3.5" (1.613 m)   Wt 129 lb (58.5 kg)   BMI 22.49 kg/m  Physical Exam  Constitutional: [name] is oriented to person, place, and time. [name] appears well-developed and well-nourished. No distress.  HENT:  Head: Normocephalic and atraumatic.  Mouth/Throat: No oropharyngeal exudate.  Eyes: Conjunctivae and EOM are normal. No scleral icterus.  Neck: Neck supple. No thyromegaly present.  No bruits  Cardiovascular: Normal rate, regular rhythm and normal heart sounds.  Exam reveals no gallop and no friction rub.   No murmur heard. Pulmonary/Chest: Effort normal and breath sounds normal. No respiratory distress. [name] has no wheezes.  Abdominal: Soft. [name] exhibits no distension. There is no tenderness.  Musculoskeletal: [name] exhibits no edema or deformity.  Lymphadenopathy:    [name] has no cervical adenopathy.  Neurological: [name] is alert and oriented to person, place, and time. No cranial nerve deficit.  Motor 5/5 all 4 ext  Psychiatric: [name] has a normal mood and affect.  Vitals reviewed.    Diagnostic Tests: Carlton Adam Myoview 5/4 2018  The left ventricular ejection fraction is mildly decreased (45-54%).  Nuclear stress EF: 47%.  No T wave inversion was noted during stress.  There was no ST  segment deviation noted during stress.  This is an intermediate risk study.   Normal perfusion. LVEF 47% (but visually appears higher). This is an intermediate risk study due to decreased LV function. Consider clinical correlation with echo.  ECHOCARDIOGRAM 10/08/2016 Study Conclusions  - Left ventricle: The cavity size was normal. There was moderate   concentric hypertrophy. Systolic function was normal. The   estimated ejection fraction was in the range of 55% to 60%. Wall   motion was normal; there were no regional wall motion   abnormalities. Doppler parameters are consistent with abnormal   left ventricular relaxation (grade 1 diastolic dysfunction).   There was no evidence of elevated ventricular filling pressure by   Doppler parameters. - Aortic valve: Trileaflet; normal thickness leaflets. There was no   regurgitation. - Aortic root: The aortic root was normal in size. - Mitral valve: Structurally normal valve. There was mild   regurgitation. - Left atrium: The atrium was normal in size. - Right ventricle: The cavity size was normal. Wall thickness was   normal. Systolic function was normal. - Right atrium: The atrium was normal in size. - Tricuspid valve: There was moderate regurgitation. - Pulmonary arteries: Systolic pressure was moderately increased.   PA peak pressure: 43 mm Hg (S). - Inferior vena cava: The vessel was dilated. The respirophasic   diameter changes were blunted (< 50%), consistent with elevated   central venous pressure. - Pericardium, extracardiac: There was no pericardial effusion.  CARDIAC CATHETERIZATION Conclusion     Ost LAD to Prox LAD lesion, 50 %stenosed.  Mid LAD to Dist LAD lesion, 70 %stenosed.  Prox LAD lesion, 85 %stenosed.  Ost 2nd Diag to 2nd Diag lesion, 50 %stenosed.  1st Mrg lesion, 60 %stenosed.  Prox Cx to Mid Cx lesion, 50 %stenosed.  Prox RCA lesion, 90 %stenosed.  1st RPLB lesion, 50 %stenosed.  LV end  diastolic pressure is normal.   1. 3 vessel CAD    - diffuse proximal to mid LAD with more focal lesion at the second diagonal of 85%. Strongly positive FFR.   - 70% distal LAD   - 60% first OM   - diffuse 50% mid LCx   - 90% proximal RCA 2. Normal  LVEDP    I personally reviewed the cardiac cath images and concur with the findings noted above. [name] has significant LAD disease and a tight lesion in a small RCA. Moderate circumflex disease.  Impression: Ms. Vonbehren is a 56 year old woman with hypertension, hyperlipidemia, a family history of coronary disease, and a light history of tobacco use who presents with chest pain. Workup included a stress Myoview which showed no evidence of ischemia but did show some LV dysfunction. An echocardiogram contradicted that in showed that [name] LV function was preserved. [name] did not have any significant valvular pathology. [name] then had cardiac catheterization which showed significant LAD disease, there also was disease in the small right coronary and moderate circumflex stenosis.  While some component of [name] chest pain would be consistent with angina, I do think [name] are out of proportion to the amount of disease found on catheterization. I was completely honest with [name] regarding this and I'm not sure that bypass grafting will completely resolve [name]. I do think [name] has sufficient coronary disease to be causing some angina and [name] certainly has hemodynamically significant disease in the LAD.  Coronary bypass grafting would be indicated for pain relief and preservation of myocardium. I described the procedure to [name] in great detail. [name] understands the need for general anesthesia, the incisions to be use, use of cardiac pulmonary bypass, the expected hospital stay, and the overall recovery. I informed [name] of the indications, risks, benefits, and alternatives. [name] understands the risks include, but are not limited to death, MI, DVT, PE, bleeding,  possible need for transfusion, infection, cardiac arrhythmias, respiratory or renal failure, gastrointestinal, complications, as well as the possibility of other unforeseeable complications.  [name] strongly wishes to proceed with coronary bypass grafting, even with the understanding that [name] may or may not have relief from [name] pain.   Plan: Coronary artery bypass grafting on Thursday, 12/08/2016   Melrose Nakayama, MD [facility] [phone]

## 2016-11-14 DIAGNOSIS — I10 Essential (primary) hypertension: Secondary | ICD-10-CM | POA: Diagnosis not present

## 2016-11-14 DIAGNOSIS — R7303 Prediabetes: Secondary | ICD-10-CM | POA: Diagnosis not present

## 2016-11-14 DIAGNOSIS — I6990 Unspecified sequelae of unspecified cerebrovascular disease: Secondary | ICD-10-CM | POA: Diagnosis not present

## 2016-11-14 DIAGNOSIS — I2510 Atherosclerotic heart disease of native coronary artery without angina pectoris: Secondary | ICD-10-CM | POA: Diagnosis not present

## 2016-11-26 ENCOUNTER — Encounter (HOSPITAL_COMMUNITY)
Admission: RE | Admit: 2016-11-26 | Discharge: 2016-11-26 | Disposition: A | Discharge status: Home / Self Care | Payer: PPO | Source: Ambulatory Visit | Attending: Thoracic Surgery (Cardiothoracic Vascular Surgery) | Admitting: Thoracic Surgery (Cardiothoracic Vascular Surgery)

## 2016-11-26 ENCOUNTER — Ambulatory Visit (HOSPITAL_COMMUNITY)
Admission: RE | Admit: 2016-11-26 | Discharge: 2016-11-26 | Disposition: A | Discharge status: Home / Self Care | Payer: PPO | Source: Ambulatory Visit | Attending: Thoracic Surgery (Cardiothoracic Vascular Surgery) | Admitting: Thoracic Surgery (Cardiothoracic Vascular Surgery)

## 2016-11-26 ENCOUNTER — Ambulatory Visit (HOSPITAL_BASED_OUTPATIENT_CLINIC_OR_DEPARTMENT_OTHER)
Admission: RE | Admit: 2016-11-26 | Discharge: 2016-11-26 | Disposition: A | Discharge status: Home / Self Care | Payer: PPO | Source: Ambulatory Visit | Attending: Thoracic Surgery (Cardiothoracic Vascular Surgery) | Admitting: Thoracic Surgery (Cardiothoracic Vascular Surgery)

## 2016-11-26 DIAGNOSIS — Z79899 Other long term (current) drug therapy: Secondary | ICD-10-CM | POA: Diagnosis not present

## 2016-11-26 DIAGNOSIS — D62 Acute posthemorrhagic anemia: Secondary | ICD-10-CM | POA: Diagnosis not present

## 2016-11-26 DIAGNOSIS — Z91040 Latex allergy status: Secondary | ICD-10-CM | POA: Diagnosis not present

## 2016-11-26 DIAGNOSIS — F17210 Nicotine dependence, cigarettes, uncomplicated: Secondary | ICD-10-CM

## 2016-11-26 DIAGNOSIS — Z8249 Family history of ischemic heart disease and other diseases of the circulatory system: Secondary | ICD-10-CM | POA: Diagnosis not present

## 2016-11-26 DIAGNOSIS — R9431 Abnormal electrocardiogram [ECG] [EKG]: Secondary | ICD-10-CM

## 2016-11-26 DIAGNOSIS — E8770 Fluid overload, unspecified: Secondary | ICD-10-CM | POA: Diagnosis not present

## 2016-11-26 DIAGNOSIS — J449 Chronic obstructive pulmonary disease, unspecified: Secondary | ICD-10-CM | POA: Diagnosis present

## 2016-11-26 DIAGNOSIS — Z8673 Personal history of transient ischemic attack (TIA), and cerebral infarction without residual deficits: Secondary | ICD-10-CM | POA: Diagnosis not present

## 2016-11-26 DIAGNOSIS — I469 Cardiac arrest, cause unspecified: Secondary | ICD-10-CM | POA: Diagnosis not present

## 2016-11-26 DIAGNOSIS — Z90710 Acquired absence of both cervix and uterus: Secondary | ICD-10-CM | POA: Diagnosis not present

## 2016-11-26 DIAGNOSIS — I081 Rheumatic disorders of both mitral and tricuspid valves: Secondary | ICD-10-CM | POA: Diagnosis not present

## 2016-11-26 DIAGNOSIS — Z01810 Encounter for preprocedural cardiovascular examination: Secondary | ICD-10-CM

## 2016-11-26 DIAGNOSIS — Z7951 Long term (current) use of inhaled steroids: Secondary | ICD-10-CM | POA: Diagnosis not present

## 2016-11-26 DIAGNOSIS — Z7982 Long term (current) use of aspirin: Secondary | ICD-10-CM | POA: Diagnosis not present

## 2016-11-26 DIAGNOSIS — I2510 Atherosclerotic heart disease of native coronary artery without angina pectoris: Secondary | ICD-10-CM

## 2016-11-26 DIAGNOSIS — K219 Gastro-esophageal reflux disease without esophagitis: Secondary | ICD-10-CM | POA: Diagnosis present

## 2016-11-26 DIAGNOSIS — F319 Bipolar disorder, unspecified: Secondary | ICD-10-CM | POA: Diagnosis present

## 2016-11-26 DIAGNOSIS — R590 Localized enlarged lymph nodes: Secondary | ICD-10-CM | POA: Diagnosis not present

## 2016-11-26 DIAGNOSIS — Z01818 Encounter for other preprocedural examination: Secondary | ICD-10-CM

## 2016-11-26 DIAGNOSIS — E039 Hypothyroidism, unspecified: Secondary | ICD-10-CM | POA: Diagnosis present

## 2016-11-26 DIAGNOSIS — Z791 Long term (current) use of non-steroidal anti-inflammatories (NSAID): Secondary | ICD-10-CM | POA: Diagnosis not present

## 2016-11-26 DIAGNOSIS — J9811 Atelectasis: Secondary | ICD-10-CM | POA: Diagnosis not present

## 2016-11-26 DIAGNOSIS — J90 Pleural effusion, not elsewhere classified: Secondary | ICD-10-CM | POA: Diagnosis not present

## 2016-11-26 DIAGNOSIS — Z91048 Other nonmedicinal substance allergy status: Secondary | ICD-10-CM | POA: Diagnosis not present

## 2016-11-26 DIAGNOSIS — I25119 Atherosclerotic heart disease of native coronary artery with unspecified angina pectoris: Secondary | ICD-10-CM | POA: Diagnosis present

## 2016-11-26 DIAGNOSIS — Z880 Allergy status to penicillin: Secondary | ICD-10-CM | POA: Diagnosis not present

## 2016-11-26 DIAGNOSIS — E874 Mixed disorder of acid-base balance: Secondary | ICD-10-CM | POA: Diagnosis present

## 2016-11-26 DIAGNOSIS — Z8542 Personal history of malignant neoplasm of other parts of uterus: Secondary | ICD-10-CM | POA: Diagnosis not present

## 2016-11-26 DIAGNOSIS — I10 Essential (primary) hypertension: Secondary | ICD-10-CM | POA: Diagnosis present

## 2016-11-26 DIAGNOSIS — F4310 Post-traumatic stress disorder, unspecified: Secondary | ICD-10-CM | POA: Diagnosis present

## 2016-11-26 LAB — PULMONARY FUNCTION TEST
DL/VA % pred: 87 %
DL/VA: 4.12 ml/min/mmHg/L
DLCO UNC: 13.14 ml/min/mmHg
DLCO cor % pred: 57 %
DLCO cor: 13.48 ml/min/mmHg
DLCO unc % pred: 55 %
FEF 25-75 PRE: 0.86 L/s
FEF 25-75 Post: 0.96 L/sec
FEF2575-%Change-Post: 11 %
FEF2575-%PRED-POST: 44 %
FEF2575-%Pred-Pre: 39 %
FEV1-%Change-Post: 2 %
FEV1-%PRED-POST: 65 %
FEV1-%PRED-PRE: 63 %
FEV1-POST: 1.38 L
FEV1-PRE: 1.35 L
FEV1FVC-%Change-Post: 9 %
FEV1FVC-%PRED-PRE: 81 %
FEV6-%Change-Post: -7 %
FEV6-%PRED-POST: 72 %
FEV6-%Pred-Pre: 78 %
FEV6-POST: 1.89 L
FEV6-PRE: 2.04 L
FEV6FVC-%CHANGE-POST: 0 %
FEV6FVC-%PRED-PRE: 102 %
FEV6FVC-%Pred-Post: 103 %
FVC-%CHANGE-POST: -7 %
FVC-%Pred-Post: 71 %
FVC-%Pred-Pre: 77 %
FVC-Post: 1.93 L
FVC-Pre: 2.08 L
POST FEV1/FVC RATIO: 72 %
PRE FEV6/FVC RATIO: 100 %
Post FEV6/FVC ratio: 100 %
Pre FEV1/FVC ratio: 65 %
RV % PRED: 68 %
RV: 1.31 L
TLC % PRED: 69 %
TLC: 3.48 L

## 2016-11-26 LAB — URINALYSIS, ROUTINE W REFLEX MICROSCOPIC
Bilirubin Urine: NEGATIVE
Glucose, UA: NEGATIVE mg/dL
Hgb urine dipstick: NEGATIVE
KETONES UR: NEGATIVE mg/dL
LEUKOCYTES UA: NEGATIVE
NITRITE: NEGATIVE
PH: 5.5 (ref 5.0–8.0)
PROTEIN: NEGATIVE mg/dL
Specific Gravity, Urine: 1.025 (ref 1.005–1.030)

## 2016-11-26 LAB — CBC
HEMATOCRIT: 37.4 % (ref 36.0–46.0)
HEMOGLOBIN: 11.8 g/dL — AB (ref 12.0–15.0)
MCH: 26.3 pg (ref 26.0–34.0)
MCHC: 31.6 g/dL (ref 30.0–36.0)
MCV: 83.3 fL (ref 78.0–100.0)
PLATELETS: 213 10*3/uL (ref 150–400)
RBC: 4.49 MIL/uL (ref 3.87–5.11)
RDW: 17.1 % — ABNORMAL HIGH (ref 11.5–15.5)
WBC: 7.5 10*3/uL (ref 4.0–10.5)

## 2016-11-26 LAB — BLOOD GAS, ARTERIAL
Acid-base deficit: 0.8 mmol/L (ref 0.0–2.0)
Bicarbonate: 23.3 mmol/L (ref 20.0–28.0)
DRAWN BY: 470951
FIO2: 21
O2 Saturation: 97.6 %
PCO2 ART: 37.9 mmHg (ref 32.0–48.0)
PH ART: 7.405 (ref 7.350–7.450)
Patient temperature: 98.6
pO2, Arterial: 103 mmHg (ref 83.0–108.0)

## 2016-11-26 LAB — VAS US DOPPLER PRE CABG
LCCADDIAS: -32 cm/s
LEFT ECA DIAS: -10 cm/s
LEFT VERTEBRAL DIAS: -23 cm/s
LICADSYS: -116 cm/s
LICAPSYS: -105 cm/s
Left CCA dist sys: -81 cm/s
Left CCA prox dias: 23 cm/s
Left CCA prox sys: 80 cm/s
Left ICA dist dias: -46 cm/s
Left ICA prox dias: -39 cm/s
RCCADSYS: -157 cm/s
RIGHT ECA DIAS: -9 cm/s
RIGHT VERTEBRAL DIAS: -28 cm/s
Right CCA prox dias: 22 cm/s
Right CCA prox sys: 80 cm/s

## 2016-11-26 LAB — COMPREHENSIVE METABOLIC PANEL
ALT: 13 U/L — AB (ref 14–54)
AST: 16 U/L (ref 15–41)
Albumin: 3.9 g/dL (ref 3.5–5.0)
Alkaline Phosphatase: 66 U/L (ref 38–126)
Anion gap: 10 (ref 5–15)
BILIRUBIN TOTAL: 0.7 mg/dL (ref 0.3–1.2)
BUN: 12 mg/dL (ref 6–20)
CHLORIDE: 109 mmol/L (ref 101–111)
CO2: 20 mmol/L — ABNORMAL LOW (ref 22–32)
CREATININE: 0.83 mg/dL (ref 0.44–1.00)
Calcium: 9.3 mg/dL (ref 8.9–10.3)
GFR calc Af Amer: >60 mL/min (ref >60)
GFR calc non Af Amer: >60 mL/min (ref >60)
Glucose, Bld: 91 mg/dL (ref 65–99)
Potassium: 3.6 mmol/L (ref 3.5–5.1)
Sodium: 139 mmol/L (ref 135–145)
Total Protein: 6.7 g/dL (ref 6.5–8.1)

## 2016-11-26 LAB — SURGICAL PCR SCREEN
MRSA, PCR: NEGATIVE
STAPHYLOCOCCUS AUREUS: NEGATIVE

## 2016-11-26 LAB — ABO/RH: ABO/RH(D): B POS

## 2016-11-26 LAB — PROTIME-INR
INR: 1.1
PROTHROMBIN TIME: 14.2 s (ref 11.4–15.2)

## 2016-11-26 LAB — APTT: aPTT: 34 seconds (ref 24–36)

## 2016-11-26 MED ORDER — ALBUTEROL SULFATE (2.5 MG/3ML) 0.083% IN NEBU
2.5000 mg | INHALATION_SOLUTION | Freq: Once | RESPIRATORY_TRACT | Status: AC
  Administered 2016-11-26: 2.5 mg via RESPIRATORY_TRACT

## 2016-11-26 MED ORDER — CHLORHEXIDINE GLUCONATE 4 % EX LIQD: 30.0000 mL | CUTANEOUS | Status: DC

## 2016-11-26 NOTE — Pre-Procedure Instructions (Signed)
    CHANCE KARAM  11/26/2016      [address] [place] [address] Phone: [phone] Fax: [phone]    Your procedure is scheduled on November 29, 2016.  Report to [facility] Admitting at 6 A.M.  Call this number if you have problems the morning of surgery:  [phone]   Remember:  Do not eat food or drink liquids after midnight.  Take these medicines the morning of surgery with A SIP OF WATER :omeprazole (PRILOSEC), busPIRone (BUSPAR), diltiazem (TIAZAC),  DULoxetine (CYMBALTA), gabapentin (NEURONTIN),  if needed methocarbamol (ROBAXIN) or oxyCODONE-acetaminophen (PERCOCET)    STOP NSAIDS (aspirin, advil, aleve, ibuprofen, naproxen), meloxicam (MOBIC), vitamins, fish oil, herbal medications one week prior to  surgery   Do not wear jewelry, make-up or nail polish.  Do not wear lotions, powders, or perfumes, or deoderant.  Do not shave 48 hours prior to surgery.    Do not bring valuables to the hospital.  [facility] is not responsible for any belongings or valuables.  Contacts, dentures or bridgework may not be worn into surgery.  Leave your suitcase in the car.  After surgery it may be brought to your room.  For patients admitted to the hospital, discharge time will be determined by your treatment team.  Patients discharged the day of surgery will not be allowed to drive home.   Name and phone number of your driver:    Special instructions:  "Preparing for surgery"  Please read over the following fact sheets that you were given. Pain Booklet and Surgical Site Infection Prevention

## 2016-11-26 NOTE — Progress Notes (Signed)
Pre-op Cardiac Surgery  Carotid Findings:  Bilateral 40-59% ICA stenosis, antegrade vertebral flow with elevated velocities.   Upper Extremity Right Left  Brachial Pressures 178, Tri 183, Tri  Radial Waveforms Tri Tri  Ulnar Waveforms Tri Tri  Palmar Arch (Allen's Test) waveform reverses with radial compression na increases less than 50% with ulnar compression waveform reverses with radial compression and increases greater than 50% with ulnar compression   Lower  Extremity Right Left  Dorsalis Pedis 90,Bi 97, Bi  Posterior Tibial 91, mono 106, mono  Ankle/Brachial Indices 0.49 0.58    Findings:  Bilateral ABI's indicate moderate peripheral arterial disease, the Right ABI is borderline severe

## 2016-11-27 LAB — HEMOGLOBIN A1C
HEMOGLOBIN A1C: 5.9 % — AB (ref 4.8–5.6)
MEAN PLASMA GLUCOSE: 123 mg/dL

## 2016-11-28 MED ORDER — SODIUM CHLORIDE 0.9 % IV SOLN
INTRAVENOUS | Status: DC
  Filled 2016-11-28 (×2): qty 30

## 2016-11-28 MED ORDER — EPINEPHRINE PF 1 MG/ML IJ SOLN
0.0000 ug/min | INTRAMUSCULAR | Status: DC
  Filled 2016-11-28 (×2): qty 4

## 2016-11-28 MED ORDER — SODIUM CHLORIDE 0.9 % IV SOLN
1.5000 mg/kg/h | INTRAVENOUS | Status: AC
  Administered 2016-11-29: 1.5 mg/kg/h via INTRAVENOUS
  Filled 2016-11-28 (×2): qty 25

## 2016-11-28 MED ORDER — DOPAMINE-DEXTROSE 3.2-5 MG/ML-% IV SOLN
0.0000 ug/kg/min | INTRAVENOUS | Status: AC
  Administered 2016-11-29: 3 ug/kg/min via INTRAVENOUS
  Filled 2016-11-28: qty 250

## 2016-11-28 MED ORDER — SODIUM CHLORIDE 0.9 % IV SOLN
INTRAVENOUS | Status: AC
  Administered 2016-11-29: 1 [IU]/h via INTRAVENOUS
  Filled 2016-11-28 (×2): qty 1

## 2016-11-28 MED ORDER — VANCOMYCIN HCL 10 G IV SOLR
1250.0000 mg | INTRAVENOUS | Status: AC
  Administered 2016-11-29: 1250 mg via INTRAVENOUS
  Filled 2016-11-28 (×2): qty 1250

## 2016-11-28 MED ORDER — NITROGLYCERIN IN D5W 200-5 MCG/ML-% IV SOLN
2.0000 ug/min | INTRAVENOUS | Status: DC
  Filled 2016-11-28 (×2): qty 250

## 2016-11-28 MED ORDER — POTASSIUM CHLORIDE 2 MEQ/ML IV SOLN
80.0000 meq | INTRAVENOUS | Status: DC
  Filled 2016-11-28 (×2): qty 40

## 2016-11-28 MED ORDER — DEXMEDETOMIDINE HCL IN NACL 400 MCG/100ML IV SOLN
0.1000 ug/kg/h | INTRAVENOUS | Status: AC
  Administered 2016-11-29: .3 ug/kg/h via INTRAVENOUS
  Filled 2016-11-28 (×2): qty 100

## 2016-11-28 MED ORDER — LEVOFLOXACIN IN D5W 500 MG/100ML IV SOLN
500.0000 mg | INTRAVENOUS | Status: AC
  Administered 2016-11-29: 500 mg via INTRAVENOUS
  Filled 2016-11-28 (×2): qty 100

## 2016-11-28 MED ORDER — TRANEXAMIC ACID (OHS) BOLUS VIA INFUSION
15.0000 mg/kg | INTRAVENOUS | Status: AC
  Administered 2016-11-29: 826.5 mg via INTRAVENOUS
  Filled 2016-11-28: qty 827

## 2016-11-28 MED ORDER — SODIUM CHLORIDE 0.9 % IV SOLN
30.0000 ug/min | INTRAVENOUS | Status: AC
  Administered 2016-11-29: 25 ug/min via INTRAVENOUS
  Filled 2016-11-28 (×2): qty 2

## 2016-11-28 MED ORDER — TRANEXAMIC ACID (OHS) PUMP PRIME SOLUTION
2.0000 mg/kg | INTRAVENOUS | Status: DC
  Filled 2016-11-28 (×2): qty 1.1

## 2016-11-28 MED ORDER — PLASMA-LYTE 148 IV SOLN
INTRAVENOUS | Status: AC
  Administered 2016-11-29: 09:00:00
  Filled 2016-11-28 (×2): qty 2.5

## 2016-11-28 MED ORDER — MAGNESIUM SULFATE 50 % IJ SOLN
40.0000 meq | INTRAMUSCULAR | Status: DC
  Filled 2016-11-28 (×2): qty 10

## 2016-11-29 ENCOUNTER — Inpatient Hospital Stay (HOSPITAL_COMMUNITY)
Admission: RE | Disposition: E | Payer: Self-pay | Source: Ambulatory Visit | Attending: Thoracic Surgery (Cardiothoracic Vascular Surgery)

## 2016-11-29 ENCOUNTER — Inpatient Hospital Stay (HOSPITAL_COMMUNITY): Payer: PPO | Admitting: Anesthesiology

## 2016-11-29 ENCOUNTER — Inpatient Hospital Stay (HOSPITAL_COMMUNITY): Payer: PPO

## 2016-11-29 ENCOUNTER — Encounter (HOSPITAL_COMMUNITY): Payer: Self-pay

## 2016-11-29 ENCOUNTER — Inpatient Hospital Stay (HOSPITAL_COMMUNITY)
Admission: RE | Admit: 2016-11-29 | Discharge: 2016-12-26 | DRG: 236 | Disposition: E | Payer: PPO | Source: Ambulatory Visit | Attending: Thoracic Surgery (Cardiothoracic Vascular Surgery) | Admitting: Thoracic Surgery (Cardiothoracic Vascular Surgery)

## 2016-11-29 DIAGNOSIS — Z791 Long term (current) use of non-steroidal anti-inflammatories (NSAID): Secondary | ICD-10-CM | POA: Diagnosis not present

## 2016-11-29 DIAGNOSIS — I469 Cardiac arrest, cause unspecified: Secondary | ICD-10-CM | POA: Diagnosis not present

## 2016-11-29 DIAGNOSIS — Z8542 Personal history of malignant neoplasm of other parts of uterus: Secondary | ICD-10-CM | POA: Diagnosis not present

## 2016-11-29 DIAGNOSIS — Z951 Presence of aortocoronary bypass graft: Secondary | ICD-10-CM

## 2016-11-29 DIAGNOSIS — Z8673 Personal history of transient ischemic attack (TIA), and cerebral infarction without residual deficits: Secondary | ICD-10-CM

## 2016-11-29 DIAGNOSIS — E8770 Fluid overload, unspecified: Secondary | ICD-10-CM | POA: Diagnosis not present

## 2016-11-29 DIAGNOSIS — Z8249 Family history of ischemic heart disease and other diseases of the circulatory system: Secondary | ICD-10-CM | POA: Diagnosis not present

## 2016-11-29 DIAGNOSIS — F319 Bipolar disorder, unspecified: Secondary | ICD-10-CM | POA: Diagnosis present

## 2016-11-29 DIAGNOSIS — E874 Mixed disorder of acid-base balance: Secondary | ICD-10-CM | POA: Diagnosis present

## 2016-11-29 DIAGNOSIS — I2510 Atherosclerotic heart disease of native coronary artery without angina pectoris: Secondary | ICD-10-CM

## 2016-11-29 DIAGNOSIS — Z91048 Other nonmedicinal substance allergy status: Secondary | ICD-10-CM | POA: Diagnosis not present

## 2016-11-29 DIAGNOSIS — F4310 Post-traumatic stress disorder, unspecified: Secondary | ICD-10-CM | POA: Diagnosis present

## 2016-11-29 DIAGNOSIS — I10 Essential (primary) hypertension: Secondary | ICD-10-CM | POA: Diagnosis present

## 2016-11-29 DIAGNOSIS — I25119 Atherosclerotic heart disease of native coronary artery with unspecified angina pectoris: Principal | ICD-10-CM | POA: Diagnosis present

## 2016-11-29 DIAGNOSIS — R0902 Hypoxemia: Secondary | ICD-10-CM | POA: Diagnosis present

## 2016-11-29 DIAGNOSIS — Z09 Encounter for follow-up examination after completed treatment for conditions other than malignant neoplasm: Secondary | ICD-10-CM

## 2016-11-29 DIAGNOSIS — Z880 Allergy status to penicillin: Secondary | ICD-10-CM

## 2016-11-29 DIAGNOSIS — Z7951 Long term (current) use of inhaled steroids: Secondary | ICD-10-CM | POA: Diagnosis not present

## 2016-11-29 DIAGNOSIS — R9431 Abnormal electrocardiogram [ECG] [EKG]: Secondary | ICD-10-CM | POA: Diagnosis present

## 2016-11-29 DIAGNOSIS — Z91040 Latex allergy status: Secondary | ICD-10-CM | POA: Diagnosis not present

## 2016-11-29 DIAGNOSIS — J449 Chronic obstructive pulmonary disease, unspecified: Secondary | ICD-10-CM | POA: Diagnosis present

## 2016-11-29 DIAGNOSIS — D62 Acute posthemorrhagic anemia: Secondary | ICD-10-CM | POA: Diagnosis not present

## 2016-11-29 DIAGNOSIS — E039 Hypothyroidism, unspecified: Secondary | ICD-10-CM | POA: Diagnosis present

## 2016-11-29 DIAGNOSIS — Z90710 Acquired absence of both cervix and uterus: Secondary | ICD-10-CM | POA: Diagnosis not present

## 2016-11-29 DIAGNOSIS — Z789 Other specified health status: Secondary | ICD-10-CM

## 2016-11-29 DIAGNOSIS — K219 Gastro-esophageal reflux disease without esophagitis: Secondary | ICD-10-CM | POA: Diagnosis present

## 2016-11-29 DIAGNOSIS — Z79899 Other long term (current) drug therapy: Secondary | ICD-10-CM | POA: Diagnosis not present

## 2016-11-29 DIAGNOSIS — F17210 Nicotine dependence, cigarettes, uncomplicated: Secondary | ICD-10-CM | POA: Diagnosis present

## 2016-11-29 DIAGNOSIS — Z7982 Long term (current) use of aspirin: Secondary | ICD-10-CM | POA: Diagnosis not present

## 2016-11-29 DIAGNOSIS — R590 Localized enlarged lymph nodes: Secondary | ICD-10-CM | POA: Diagnosis not present

## 2016-11-29 HISTORY — PX: CORONARY ARTERY BYPASS GRAFT: SHX141

## 2016-11-29 HISTORY — PX: TEE WITHOUT CARDIOVERSION: SHX5443

## 2016-11-29 LAB — POCT I-STAT, CHEM 8
BUN: 16 mg/dL (ref 6–20)
BUN: 14 mg/dL (ref 6–20)
BUN: 12 mg/dL (ref 6–20)
BUN: 12 mg/dL (ref 6–20)
BUN: 13 mg/dL (ref 6–20)
BUN: 11 mg/dL (ref 6–20)
CALCIUM ION: 1.31 mmol/L (ref 1.15–1.40)
CALCIUM ION: 1.24 mmol/L (ref 1.15–1.40)
CALCIUM ION: 1.02 mmol/L — AB (ref 1.15–1.40)
CALCIUM ION: 1.06 mmol/L — AB (ref 1.15–1.40)
CALCIUM ION: 1.09 mmol/L — AB (ref 1.15–1.40)
CALCIUM ION: 1.21 mmol/L (ref 1.15–1.40)
CHLORIDE: 108 mmol/L (ref 101–111)
CHLORIDE: 108 mmol/L (ref 101–111)
CREATININE: 0.6 mg/dL (ref 0.44–1.00)
CREATININE: 0.3 mg/dL — AB (ref 0.44–1.00)
Chloride: 104 mmol/L (ref 101–111)
Chloride: 108 mmol/L (ref 101–111)
Chloride: 108 mmol/L (ref 101–111)
Chloride: 109 mmol/L (ref 101–111)
Creatinine, Ser: 0.7 mg/dL (ref 0.44–1.00)
Creatinine, Ser: 0.4 mg/dL — ABNORMAL LOW (ref 0.44–1.00)
Creatinine, Ser: 0.5 mg/dL (ref 0.44–1.00)
Creatinine, Ser: 0.6 mg/dL (ref 0.44–1.00)
GLUCOSE: 111 mg/dL — AB (ref 65–99)
GLUCOSE: 88 mg/dL (ref 65–99)
GLUCOSE: 130 mg/dL — AB (ref 65–99)
Glucose, Bld: 110 mg/dL — ABNORMAL HIGH (ref 65–99)
Glucose, Bld: 122 mg/dL — ABNORMAL HIGH (ref 65–99)
Glucose, Bld: 104 mg/dL — ABNORMAL HIGH (ref 65–99)
HCT: 30 % — ABNORMAL LOW (ref 36.0–46.0)
HCT: 29 % — ABNORMAL LOW (ref 36.0–46.0)
HCT: 21 % — ABNORMAL LOW (ref 36.0–46.0)
HCT: 20 % — ABNORMAL LOW (ref 36.0–46.0)
HCT: 20 % — ABNORMAL LOW (ref 36.0–46.0)
HEMATOCRIT: 21 % — AB (ref 36.0–46.0)
HEMOGLOBIN: 10.2 g/dL — AB (ref 12.0–15.0)
HEMOGLOBIN: 7.1 g/dL — AB (ref 12.0–15.0)
Hemoglobin: 9.9 g/dL — ABNORMAL LOW (ref 12.0–15.0)
Hemoglobin: 7.1 g/dL — ABNORMAL LOW (ref 12.0–15.0)
Hemoglobin: 6.8 g/dL — CL (ref 12.0–15.0)
Hemoglobin: 6.8 g/dL — CL (ref 12.0–15.0)
POTASSIUM: 3.7 mmol/L (ref 3.5–5.1)
Potassium: 3.7 mmol/L (ref 3.5–5.1)
Potassium: 3.6 mmol/L (ref 3.5–5.1)
Potassium: 3.9 mmol/L (ref 3.5–5.1)
Potassium: 5.1 mmol/L (ref 3.5–5.1)
Potassium: 4.5 mmol/L (ref 3.5–5.1)
SODIUM: 143 mmol/L (ref 135–145)
SODIUM: 143 mmol/L (ref 135–145)
SODIUM: 142 mmol/L (ref 135–145)
SODIUM: 143 mmol/L (ref 135–145)
SODIUM: 143 mmol/L (ref 135–145)
Sodium: 141 mmol/L (ref 135–145)
TCO2: 24 mmol/L (ref 0–100)
TCO2: 22 mmol/L (ref 0–100)
TCO2: 27 mmol/L (ref 0–100)
TCO2: 23 mmol/L (ref 0–100)
TCO2: 26 mmol/L (ref 0–100)
TCO2: 21 mmol/L (ref 0–100)

## 2016-11-29 LAB — MAGNESIUM: Magnesium: 3 mg/dL — ABNORMAL HIGH (ref 1.7–2.4)

## 2016-11-29 LAB — CBC
HEMATOCRIT: 22.4 % — AB (ref 36.0–46.0)
HEMATOCRIT: 22.1 % — AB (ref 36.0–46.0)
HEMOGLOBIN: 7.1 g/dL — AB (ref 12.0–15.0)
Hemoglobin: 7.1 g/dL — ABNORMAL LOW (ref 12.0–15.0)
MCH: 26.3 pg (ref 26.0–34.0)
MCH: 26.7 pg (ref 26.0–34.0)
MCHC: 31.7 g/dL (ref 30.0–36.0)
MCHC: 32.1 g/dL (ref 30.0–36.0)
MCV: 83 fL (ref 78.0–100.0)
MCV: 83.1 fL (ref 78.0–100.0)
PLATELETS: 125 10*3/uL — AB (ref 150–400)
Platelets: 100 10*3/uL — ABNORMAL LOW (ref 150–400)
RBC: 2.7 MIL/uL — ABNORMAL LOW (ref 3.87–5.11)
RBC: 2.66 MIL/uL — ABNORMAL LOW (ref 3.87–5.11)
RDW: 16.8 % — ABNORMAL HIGH (ref 11.5–15.5)
RDW: 17 % — AB (ref 11.5–15.5)
WBC: 10.5 10*3/uL (ref 4.0–10.5)
WBC: 10.4 10*3/uL (ref 4.0–10.5)

## 2016-11-29 LAB — POCT I-STAT 3, ART BLOOD GAS (G3+)
ACID-BASE DEFICIT: 1 mmol/L (ref 0.0–2.0)
ACID-BASE DEFICIT: 4 mmol/L — AB (ref 0.0–2.0)
ACID-BASE EXCESS: 2 mmol/L (ref 0.0–2.0)
Acid-base deficit: 3 mmol/L — ABNORMAL HIGH (ref 0.0–2.0)
BICARBONATE: 23 mmol/L (ref 20.0–28.0)
Bicarbonate: 26.2 mmol/L (ref 20.0–28.0)
Bicarbonate: 22.9 mmol/L (ref 20.0–28.0)
Bicarbonate: 21 mmol/L (ref 20.0–28.0)
O2 SAT: 100 %
O2 SAT: 100 %
O2 SAT: 97 %
O2 Saturation: 99 %
PCO2 ART: 35.2 mmHg (ref 32.0–48.0)
PCO2 ART: 31.6 mmHg — AB (ref 32.0–48.0)
PH ART: 7.478 — AB (ref 7.350–7.450)
PH ART: 7.238 — AB (ref 7.350–7.450)
PO2 ART: 171 mmHg — AB (ref 83.0–108.0)
TCO2: 27 mmol/L (ref 0–100)
TCO2: 24 mmol/L (ref 0–100)
TCO2: 22 mmol/L (ref 0–100)
TCO2: 25 mmol/L (ref 0–100)
pCO2 arterial: 27.6 mmHg — ABNORMAL LOW (ref 32.0–48.0)
pCO2 arterial: 53.9 mmHg — ABNORMAL HIGH (ref 32.0–48.0)
pH, Arterial: 7.479 — ABNORMAL HIGH (ref 7.350–7.450)
pH, Arterial: 7.467 — ABNORMAL HIGH (ref 7.350–7.450)
pO2, Arterial: 404 mmHg — ABNORMAL HIGH (ref 83.0–108.0)
pO2, Arterial: 119 mmHg — ABNORMAL HIGH (ref 83.0–108.0)
pO2, Arterial: 104 mmHg (ref 83.0–108.0)

## 2016-11-29 LAB — HEMOGLOBIN AND HEMATOCRIT, BLOOD
HEMATOCRIT: 20.7 % — AB (ref 36.0–46.0)
Hemoglobin: 6.6 g/dL — CL (ref 12.0–15.0)

## 2016-11-29 LAB — POCT I-STAT 4, (NA,K, GLUC, HGB,HCT)
GLUCOSE: 112 mg/dL — AB (ref 65–99)
Glucose, Bld: 111 mg/dL — ABNORMAL HIGH (ref 65–99)
HCT: 28 % — ABNORMAL LOW (ref 36.0–46.0)
HEMATOCRIT: 19 % — AB (ref 36.0–46.0)
HEMOGLOBIN: 9.5 g/dL — AB (ref 12.0–15.0)
HEMOGLOBIN: 6.5 g/dL — AB (ref 12.0–15.0)
POTASSIUM: 3.7 mmol/L (ref 3.5–5.1)
Potassium: 3.4 mmol/L — ABNORMAL LOW (ref 3.5–5.1)
SODIUM: 142 mmol/L (ref 135–145)
Sodium: 145 mmol/L (ref 135–145)

## 2016-11-29 LAB — CREATININE, SERUM
Creatinine, Ser: 0.68 mg/dL (ref 0.44–1.00)
GFR calc Af Amer: >60 mL/min (ref >60)

## 2016-11-29 LAB — GLUCOSE, CAPILLARY
GLUCOSE-CAPILLARY: 118 mg/dL — AB (ref 65–99)
GLUCOSE-CAPILLARY: 121 mg/dL — AB (ref 65–99)
GLUCOSE-CAPILLARY: 109 mg/dL — AB (ref 65–99)
GLUCOSE-CAPILLARY: 139 mg/dL — AB (ref 65–99)
Glucose-Capillary: 134 mg/dL — ABNORMAL HIGH (ref 65–99)
Glucose-Capillary: 129 mg/dL — ABNORMAL HIGH (ref 65–99)
Glucose-Capillary: 114 mg/dL — ABNORMAL HIGH (ref 65–99)
Glucose-Capillary: 106 mg/dL — ABNORMAL HIGH (ref 65–99)

## 2016-11-29 LAB — PLATELET COUNT: Platelets: 109 10*3/uL — ABNORMAL LOW (ref 150–400)

## 2016-11-29 LAB — PROTIME-INR
INR: 1.82
PROTHROMBIN TIME: 21.3 s — AB (ref 11.4–15.2)

## 2016-11-29 LAB — APTT: APTT: 44 s — AB (ref 24–36)

## 2016-11-29 SURGERY — CORONARY ARTERY BYPASS GRAFTING (CABG)
Anesthesia: General | Site: Chest

## 2016-11-29 MED ORDER — SODIUM CHLORIDE 0.9 % IV SOLN: 250.0000 mL | INTRAVENOUS | Status: DC

## 2016-11-29 MED ORDER — POTASSIUM CHLORIDE 10 MEQ/50ML IV SOLN
10.0000 meq | INTRAVENOUS | Status: AC
  Administered 2016-11-29 (×3): 10 meq via INTRAVENOUS

## 2016-11-29 MED ORDER — BISACODYL 10 MG RE SUPP: 10.0000 mg | Freq: Every day | RECTAL | Status: DC

## 2016-11-29 MED ORDER — ASPIRIN 81 MG PO CHEW: 324.0000 mg | CHEWABLE_TABLET | Freq: Every day | ORAL | Status: DC

## 2016-11-29 MED ORDER — SODIUM CHLORIDE 0.9 % IV SOLN
0.0000 ug/min | INTRAVENOUS | Status: DC
  Filled 2016-11-29: qty 2

## 2016-11-29 MED ORDER — SODIUM CHLORIDE 0.9% FLUSH
10.0000 mL | Freq: Two times a day (BID) | INTRAVENOUS | Status: DC
  Administered 2016-11-30 – 2016-12-01 (×2): 10 mL

## 2016-11-29 MED ORDER — PANTOPRAZOLE SODIUM 40 MG PO TBEC
40.0000 mg | DELAYED_RELEASE_TABLET | Freq: Every day | ORAL | Status: DC
  Administered 2016-12-01: 40 mg via ORAL
  Filled 2016-11-29 (×2): qty 1

## 2016-11-29 MED ORDER — METOPROLOL TARTRATE 12.5 MG HALF TABLET
12.5000 mg | ORAL_TABLET | Freq: Once | ORAL | Status: AC
  Administered 2016-11-29: 12.5 mg via ORAL
  Filled 2016-11-29: qty 1

## 2016-11-29 MED ORDER — SODIUM CHLORIDE 0.45 % IV SOLN: INTRAVENOUS | Status: DC | PRN

## 2016-11-29 MED ORDER — SODIUM CHLORIDE 0.9% FLUSH: 3.0000 mL | INTRAVENOUS | Status: DC | PRN

## 2016-11-29 MED ORDER — POTASSIUM CHLORIDE 10 MEQ/50ML IV SOLN: 10.0000 meq | Freq: Once | INTRAVENOUS | Status: AC

## 2016-11-29 MED ORDER — QUETIAPINE FUMARATE 100 MG PO TABS: 100.0000 mg | ORAL_TABLET | Freq: Every day | ORAL | Status: DC

## 2016-11-29 MED ORDER — HEPARIN SODIUM (PORCINE) 1000 UNIT/ML IJ SOLN
INTRAMUSCULAR | Status: DC | PRN
  Administered 2016-11-29: 21000 [IU] via INTRAVENOUS
  Administered 2016-11-29: 2000 [IU] via INTRAVENOUS

## 2016-11-29 MED ORDER — FENTANYL CITRATE (PF) 250 MCG/5ML IJ SOLN
INTRAMUSCULAR | Status: DC | PRN
Start: 2016-11-29 — End: 2016-11-29
  Administered 2016-11-29: 900 ug via INTRAVENOUS
  Administered 2016-11-29: 250 ug via INTRAVENOUS
  Administered 2016-11-29: 100 ug via INTRAVENOUS

## 2016-11-29 MED ORDER — MORPHINE SULFATE (PF) 4 MG/ML IV SOLN
2.0000 mg | INTRAVENOUS | Status: DC | PRN
  Administered 2016-11-30: 4 mg via INTRAVENOUS
  Administered 2016-11-30: 2 mg via INTRAVENOUS
  Filled 2016-11-29 (×3): qty 1

## 2016-11-29 MED ORDER — VANCOMYCIN HCL IN DEXTROSE 750-5 MG/150ML-% IV SOLN
750.0000 mg | Freq: Two times a day (BID) | INTRAVENOUS | Status: AC
  Administered 2016-11-29 – 2016-11-30 (×2): 750 mg via INTRAVENOUS
  Filled 2016-11-29 (×2): qty 150

## 2016-11-29 MED ORDER — ACETAMINOPHEN 500 MG PO TABS
1000.0000 mg | ORAL_TABLET | Freq: Four times a day (QID) | ORAL | Status: DC
  Administered 2016-11-30 – 2016-12-01 (×5): 1000 mg via ORAL
  Filled 2016-11-29 (×5): qty 2

## 2016-11-29 MED ORDER — LACTATED RINGERS IV SOLN
INTRAVENOUS | Status: DC | PRN
  Administered 2016-11-29 (×2): via INTRAVENOUS

## 2016-11-29 MED ORDER — METOPROLOL TARTRATE 25 MG/10 ML ORAL SUSPENSION
12.5000 mg | Freq: Two times a day (BID) | ORAL | Status: DC
  Administered 2016-11-29: 12.5 mg
  Filled 2016-11-29: qty 5

## 2016-11-29 MED ORDER — LACTATED RINGERS IV SOLN
INTRAVENOUS | Status: DC | PRN
  Administered 2016-11-29 (×2): via INTRAVENOUS

## 2016-11-29 MED ORDER — LIDOCAINE 2% (20 MG/ML) 5 ML SYRINGE: INTRAMUSCULAR | Status: DC | PRN

## 2016-11-29 MED ORDER — LACTATED RINGERS IV SOLN: INTRAVENOUS | Status: DC

## 2016-11-29 MED ORDER — SODIUM CHLORIDE 0.9% FLUSH: 10.0000 mL | INTRAVENOUS | Status: DC | PRN

## 2016-11-29 MED ORDER — METOPROLOL TARTRATE 12.5 MG HALF TABLET
12.5000 mg | ORAL_TABLET | Freq: Two times a day (BID) | ORAL | Status: DC
  Administered 2016-11-30 – 2016-12-01 (×2): 12.5 mg via ORAL
  Filled 2016-11-29 (×2): qty 1

## 2016-11-29 MED ORDER — CHLORHEXIDINE GLUCONATE 0.12 % MT SOLN
15.0000 mL | Freq: Once | OROMUCOSAL | Status: AC
  Administered 2016-11-29: 15 mL via OROMUCOSAL
  Filled 2016-11-29: qty 15

## 2016-11-29 MED ORDER — INSULIN REGULAR BOLUS VIA INFUSION
0.0000 [IU] | Freq: Three times a day (TID) | INTRAVENOUS | Status: DC
  Filled 2016-11-29: qty 10

## 2016-11-29 MED ORDER — DOCUSATE SODIUM 100 MG PO CAPS
200.0000 mg | ORAL_CAPSULE | Freq: Every day | ORAL | Status: DC
  Administered 2016-11-30 – 2016-12-01 (×2): 200 mg via ORAL
  Filled 2016-11-29 (×2): qty 2

## 2016-11-29 MED ORDER — LACTATED RINGERS IV SOLN: 500.0000 mL | Freq: Once | INTRAVENOUS | Status: DC | PRN

## 2016-11-29 MED ORDER — SODIUM CHLORIDE 0.9 % IV SOLN
INTRAVENOUS | Status: DC
  Administered 2016-11-29: 14:00:00 via INTRAVENOUS

## 2016-11-29 MED ORDER — PROPOFOL 10 MG/ML IV BOLUS
INTRAVENOUS | Status: DC | PRN
  Administered 2016-11-29: 50 mg via INTRAVENOUS

## 2016-11-29 MED ORDER — HEPARIN SODIUM (PORCINE) 1000 UNIT/ML IJ SOLN
INTRAMUSCULAR | Status: AC
  Filled 2016-11-29: qty 1

## 2016-11-29 MED ORDER — LURASIDONE HCL 60 MG PO TABS
60.0000 mg | ORAL_TABLET | Freq: Every evening | ORAL | Status: DC
  Administered 2016-11-30: 60 mg via ORAL
  Filled 2016-11-29 (×2): qty 1

## 2016-11-29 MED ORDER — ARTIFICIAL TEARS OPHTHALMIC OINT
TOPICAL_OINTMENT | OPHTHALMIC | Status: DC | PRN
  Administered 2016-11-29: 1 via OPHTHALMIC

## 2016-11-29 MED ORDER — ROCURONIUM BROMIDE 100 MG/10ML IV SOLN
INTRAVENOUS | Status: DC | PRN
  Administered 2016-11-29: 50 mg via INTRAVENOUS

## 2016-11-29 MED ORDER — ACETAMINOPHEN 650 MG RE SUPP
650.0000 mg | Freq: Once | RECTAL | Status: AC
  Administered 2016-11-29: 650 mg via RECTAL

## 2016-11-29 MED ORDER — PROPOFOL 10 MG/ML IV BOLUS
INTRAVENOUS | Status: AC
  Filled 2016-11-29: qty 20

## 2016-11-29 MED ORDER — HYDRALAZINE HCL 20 MG/ML IJ SOLN
INTRAMUSCULAR | Status: AC
  Administered 2016-11-29: 20 mg
  Filled 2016-11-29: qty 1

## 2016-11-29 MED ORDER — FENTANYL CITRATE (PF) 250 MCG/5ML IJ SOLN
INTRAMUSCULAR | Status: AC
  Filled 2016-11-29: qty 5

## 2016-11-29 MED ORDER — METOPROLOL TARTRATE 5 MG/5ML IV SOLN
2.5000 mg | INTRAVENOUS | Status: DC | PRN
  Administered 2016-11-30: 2.5 mg via INTRAVENOUS

## 2016-11-29 MED ORDER — BUSPIRONE HCL 5 MG PO TABS
5.0000 mg | ORAL_TABLET | Freq: Two times a day (BID) | ORAL | Status: DC
  Administered 2016-11-30 – 2016-12-01 (×3): 5 mg via ORAL
  Filled 2016-11-29 (×3): qty 1

## 2016-11-29 MED ORDER — ACETAMINOPHEN 160 MG/5ML PO SOLN: 650.0000 mg | Freq: Once | ORAL | Status: AC

## 2016-11-29 MED ORDER — VANCOMYCIN HCL IN DEXTROSE 1-5 GM/200ML-% IV SOLN: 1000.0000 mg | Freq: Once | INTRAVENOUS | Status: DC

## 2016-11-29 MED ORDER — POTASSIUM CHLORIDE 10 MEQ/50ML IV SOLN
INTRAVENOUS | Status: AC
  Administered 2016-11-29: 10 meq
  Filled 2016-11-29: qty 50

## 2016-11-29 MED ORDER — HEMOSTATIC AGENTS (NO CHARGE) OPTIME
TOPICAL | Status: DC | PRN
  Administered 2016-11-29: 1 via TOPICAL

## 2016-11-29 MED ORDER — SIMVASTATIN 40 MG PO TABS
40.0000 mg | ORAL_TABLET | Freq: Every evening | ORAL | Status: DC
Start: 2016-11-30 — End: 2016-12-02
  Administered 2016-11-30: 40 mg via ORAL
  Filled 2016-11-29: qty 1

## 2016-11-29 MED ORDER — PROTAMINE SULFATE 10 MG/ML IV SOLN
INTRAVENOUS | Status: DC | PRN
  Administered 2016-11-29 (×2): 100 mg via INTRAVENOUS

## 2016-11-29 MED ORDER — NITROGLYCERIN IN D5W 200-5 MCG/ML-% IV SOLN
0.0000 ug/min | INTRAVENOUS | Status: DC
  Administered 2016-11-30: 80 ug/min via INTRAVENOUS
  Filled 2016-11-29: qty 250

## 2016-11-29 MED ORDER — DULOXETINE HCL 60 MG PO CPEP
60.0000 mg | ORAL_CAPSULE | Freq: Every day | ORAL | Status: DC
  Administered 2016-12-01: 60 mg via ORAL
  Filled 2016-11-29 (×2): qty 1

## 2016-11-29 MED ORDER — BISACODYL 5 MG PO TBEC
10.0000 mg | DELAYED_RELEASE_TABLET | Freq: Every day | ORAL | Status: DC
  Administered 2016-11-30 – 2016-12-01 (×2): 10 mg via ORAL
  Filled 2016-11-29 (×2): qty 2

## 2016-11-29 MED ORDER — PROTAMINE SULFATE 10 MG/ML IV SOLN
INTRAVENOUS | Status: AC
  Filled 2016-11-29: qty 25

## 2016-11-29 MED ORDER — OXYCODONE HCL 5 MG PO TABS: 5.0000 mg | ORAL_TABLET | ORAL | Status: DC | PRN

## 2016-11-29 MED ORDER — VECURONIUM BROMIDE 10 MG IV SOLR
INTRAVENOUS | Status: DC | PRN
  Administered 2016-11-29: 5 mg via INTRAVENOUS
  Administered 2016-11-29: 3 mg via INTRAVENOUS
  Administered 2016-11-29: 5 mg via INTRAVENOUS

## 2016-11-29 MED ORDER — MIDAZOLAM HCL 2 MG/2ML IJ SOLN: 2.0000 mg | INTRAMUSCULAR | Status: DC | PRN

## 2016-11-29 MED ORDER — GABAPENTIN 600 MG PO TABS
600.0000 mg | ORAL_TABLET | Freq: Three times a day (TID) | ORAL | Status: DC
  Administered 2016-11-30 – 2016-12-01 (×3): 600 mg via ORAL
  Filled 2016-11-29 (×3): qty 1

## 2016-11-29 MED ORDER — VECURONIUM BROMIDE 10 MG IV SOLR
INTRAVENOUS | Status: AC
  Filled 2016-11-29: qty 20

## 2016-11-29 MED ORDER — 0.9 % SODIUM CHLORIDE (POUR BTL) OPTIME
TOPICAL | Status: DC | PRN
  Administered 2016-11-29: 6000 mL

## 2016-11-29 MED ORDER — MORPHINE SULFATE (PF) 4 MG/ML IV SOLN
1.0000 mg | INTRAVENOUS | Status: AC | PRN
  Administered 2016-11-29: 4 mg via INTRAVENOUS
  Administered 2016-11-29 (×2): 2 mg via INTRAVENOUS
  Filled 2016-11-29 (×2): qty 1

## 2016-11-29 MED ORDER — ORAL CARE MOUTH RINSE
15.0000 mL | OROMUCOSAL | Status: DC
  Administered 2016-11-29 – 2016-12-01 (×12): 15 mL via OROMUCOSAL

## 2016-11-29 MED ORDER — MIDAZOLAM HCL 10 MG/2ML IJ SOLN
INTRAMUSCULAR | Status: AC
  Filled 2016-11-29: qty 2

## 2016-11-29 MED ORDER — LACTATED RINGERS IV SOLN
INTRAVENOUS | Status: DC | PRN
  Administered 2016-11-29 (×2): via INTRAVENOUS

## 2016-11-29 MED ORDER — MIDAZOLAM HCL 5 MG/5ML IJ SOLN
INTRAMUSCULAR | Status: DC | PRN
  Administered 2016-11-29 (×4): 2 mg via INTRAVENOUS

## 2016-11-29 MED ORDER — ALBUMIN HUMAN 5 % IV SOLN
INTRAVENOUS | Status: DC | PRN
  Administered 2016-11-29 (×3): via INTRAVENOUS

## 2016-11-29 MED ORDER — LIDOCAINE HCL (CARDIAC) 20 MG/ML IV SOLN
INTRAVENOUS | Status: DC | PRN
  Administered 2016-11-29: 100 mg via INTRAVENOUS

## 2016-11-29 MED ORDER — CHLORHEXIDINE GLUCONATE 0.12% ORAL RINSE (MEDLINE KIT)
15.0000 mL | Freq: Two times a day (BID) | OROMUCOSAL | Status: DC
  Administered 2016-11-29 – 2016-11-30 (×2): 15 mL via OROMUCOSAL

## 2016-11-29 MED ORDER — FAMOTIDINE IN NACL 20-0.9 MG/50ML-% IV SOLN
20.0000 mg | Freq: Two times a day (BID) | INTRAVENOUS | Status: AC
  Administered 2016-11-29 (×2): 20 mg via INTRAVENOUS
  Filled 2016-11-29: qty 50

## 2016-11-29 MED ORDER — ASPIRIN EC 325 MG PO TBEC
325.0000 mg | DELAYED_RELEASE_TABLET | Freq: Every day | ORAL | Status: DC
  Administered 2016-11-30 – 2016-12-01 (×2): 325 mg via ORAL
  Filled 2016-11-29 (×2): qty 1

## 2016-11-29 MED ORDER — TRAMADOL HCL 50 MG PO TABS: 50.0000 mg | ORAL_TABLET | ORAL | Status: DC | PRN

## 2016-11-29 MED ORDER — MAGNESIUM SULFATE 4 GM/100ML IV SOLN
4.0000 g | Freq: Once | INTRAVENOUS | Status: AC
  Administered 2016-11-29: 4 g via INTRAVENOUS
  Filled 2016-11-29: qty 100

## 2016-11-29 MED ORDER — SODIUM CHLORIDE 0.9% FLUSH
3.0000 mL | Freq: Two times a day (BID) | INTRAVENOUS | Status: DC
  Administered 2016-11-30 – 2016-12-01 (×3): 3 mL via INTRAVENOUS

## 2016-11-29 MED ORDER — LAMOTRIGINE 200 MG PO TABS
200.0000 mg | ORAL_TABLET | Freq: Every evening | ORAL | Status: DC
Start: 2016-11-30 — End: 2016-12-02
  Administered 2016-11-30: 200 mg via ORAL
  Filled 2016-11-29 (×2): qty 1

## 2016-11-29 MED ORDER — SODIUM CHLORIDE 0.9 % IV SOLN
0.0000 ug/kg/h | INTRAVENOUS | Status: DC
  Administered 2016-11-29: 0.2 ug/kg/h via INTRAVENOUS
  Filled 2016-11-29: qty 2

## 2016-11-29 MED ORDER — CHLORHEXIDINE GLUCONATE CLOTH 2 % EX PADS
6.0000 | MEDICATED_PAD | Freq: Every day | CUTANEOUS | Status: DC
  Administered 2016-11-29 – 2016-11-30 (×2): 6 via TOPICAL

## 2016-11-29 MED ORDER — BUDESONIDE 0.25 MG/2ML IN SUSP
0.2500 mg | Freq: Two times a day (BID) | RESPIRATORY_TRACT | Status: DC
  Administered 2016-11-30 – 2016-12-01 (×3): 0.25 mg via RESPIRATORY_TRACT
  Filled 2016-11-29 (×4): qty 2

## 2016-11-29 MED ORDER — SODIUM CHLORIDE 0.9 % IV SOLN
INTRAVENOUS | Status: DC
  Filled 2016-11-29: qty 1

## 2016-11-29 MED ORDER — GELATIN ABSORBABLE MT POWD
OROMUCOSAL | Status: DC | PRN
  Administered 2016-11-29 (×3): via TOPICAL

## 2016-11-29 MED ORDER — ALBUMIN HUMAN 5 % IV SOLN
250.0000 mL | INTRAVENOUS | Status: AC | PRN
  Administered 2016-11-29 – 2016-11-30 (×4): 250 mL via INTRAVENOUS
  Filled 2016-11-29: qty 250

## 2016-11-29 MED ORDER — ONDANSETRON HCL 4 MG/2ML IJ SOLN
4.0000 mg | Freq: Four times a day (QID) | INTRAMUSCULAR | Status: DC | PRN
  Filled 2016-11-29: qty 2

## 2016-11-29 MED ORDER — ACETAMINOPHEN 160 MG/5ML PO SOLN
1000.0000 mg | Freq: Four times a day (QID) | ORAL | Status: DC
  Administered 2016-11-30 (×2): 1000 mg
  Filled 2016-11-29: qty 40.6

## 2016-11-29 MED ORDER — FENTANYL CITRATE (PF) 250 MCG/5ML IJ SOLN
INTRAMUSCULAR | Status: AC
  Filled 2016-11-29: qty 20

## 2016-11-29 MED ORDER — GLYCOPYRROLATE 0.2 MG/ML IJ SOLN
INTRAMUSCULAR | Status: DC | PRN
  Administered 2016-11-29: .2 mg via INTRAVENOUS

## 2016-11-29 MED ORDER — CHLORHEXIDINE GLUCONATE 0.12 % MT SOLN
15.0000 mL | OROMUCOSAL | Status: AC
Start: 2016-11-29 — End: 2016-11-29
  Administered 2016-11-29: 15 mL via OROMUCOSAL

## 2016-11-29 MED FILL — Heparin Sodium (Porcine) Inj 1000 Unit/ML: INTRAMUSCULAR | Qty: 30 | Status: AC

## 2016-11-29 MED FILL — Magnesium Sulfate Inj 50%: INTRAMUSCULAR | Qty: 10 | Status: AC

## 2016-11-29 MED FILL — Potassium Chloride Inj 2 mEq/ML: INTRAVENOUS | Qty: 10 | Status: AC

## 2016-11-29 SURGICAL SUPPLY — 70 items
BAG DECANTER FOR FLEXI CONT (MISCELLANEOUS) ×2 IMPLANT
BANDAGE ACE 4X5 VEL STRL LF (GAUZE/BANDAGES/DRESSINGS) ×4 IMPLANT
BANDAGE ACE 6X5 VEL STRL LF (GAUZE/BANDAGES/DRESSINGS) ×4 IMPLANT
BASKET HEART (ORDER IN 25'S) (MISCELLANEOUS) ×1
BASKET HEART (ORDER IN 25S) (MISCELLANEOUS) ×1 IMPLANT
BLADE STERNUM SYSTEM 6 (BLADE) ×2 IMPLANT
BNDG GAUZE ELAST 4 BULKY (GAUZE/BANDAGES/DRESSINGS) ×4 IMPLANT
CANISTER SUCT 3000ML PPV (MISCELLANEOUS) ×2 IMPLANT
CANNULA EZ GLIDE AORTIC 21FR (CANNULA) ×2 IMPLANT
CATH CPB KIT HENDRICKSON (MISCELLANEOUS) ×2 IMPLANT
CATH THORACIC 36FR (CATHETERS) ×2 IMPLANT
CATH THORACIC 36FR RT ANG (CATHETERS) ×2 IMPLANT
CLIP TI WIDE RED SMALL 24 (CLIP) ×2 IMPLANT
CRADLE DONUT ADULT HEAD (MISCELLANEOUS) ×2 IMPLANT
DRAPE CARDIOVASCULAR INCISE (DRAPES) ×1
DRAPE SLUSH/WARMER DISC (DRAPES) ×2 IMPLANT
DRAPE SRG 135X102X78XABS (DRAPES) ×1 IMPLANT
DRSG COVADERM 4X14 (GAUZE/BANDAGES/DRESSINGS) ×2 IMPLANT
ELECT REM PT RETURN 9FT ADLT (ELECTROSURGICAL) ×4
ELECTRODE REM PT RTRN 9FT ADLT (ELECTROSURGICAL) ×2 IMPLANT
FELT TEFLON 1X6 (MISCELLANEOUS) ×4 IMPLANT
GAUZE SPONGE 4X4 12PLY STRL LF (GAUZE/BANDAGES/DRESSINGS) ×6 IMPLANT
GLOVE BIO SURGEON STRL SZ 6.5 (GLOVE) ×10 IMPLANT
GLOVE BIO SURGEON STRL SZ7 (GLOVE) ×12 IMPLANT
GLOVE BIOGEL PI IND STRL 6.5 (GLOVE) ×1 IMPLANT
GLOVE BIOGEL PI INDICATOR 6.5 (GLOVE) ×1
GLOVE SURG SS PI 7.5 STRL IVOR (GLOVE) ×12 IMPLANT
GOWN STRL REUS W/ TWL LRG LVL3 (GOWN DISPOSABLE) ×10 IMPLANT
GOWN STRL REUS W/ TWL XL LVL3 (GOWN DISPOSABLE) ×2 IMPLANT
GOWN STRL REUS W/TWL LRG LVL3 (GOWN DISPOSABLE) ×10
GOWN STRL REUS W/TWL XL LVL3 (GOWN DISPOSABLE) ×2
HEMOSTAT POWDER SURGIFOAM 1G (HEMOSTASIS) ×6 IMPLANT
HEMOSTAT SURGICEL 2X14 (HEMOSTASIS) ×2 IMPLANT
KIT BASIN OR (CUSTOM PROCEDURE TRAY) ×2 IMPLANT
KIT ROOM TURNOVER OR (KITS) ×2 IMPLANT
KIT SUCTION CATH 14FR (SUCTIONS) ×4 IMPLANT
KIT VASOVIEW HEMOPRO VH 3000 (KITS) ×2 IMPLANT
MARKER GRAFT CORONARY BYPASS (MISCELLANEOUS) ×6 IMPLANT
NS IRRIG 1000ML POUR BTL (IV SOLUTION) ×12 IMPLANT
PACK OPEN HEART (CUSTOM PROCEDURE TRAY) ×2 IMPLANT
PAD ARMBOARD 7.5X6 YLW CONV (MISCELLANEOUS) ×4 IMPLANT
PAD ELECT DEFIB RADIOL ZOLL (MISCELLANEOUS) ×2 IMPLANT
PENCIL BUTTON HOLSTER BLD 10FT (ELECTRODE) ×2 IMPLANT
PUNCH AORTIC ROT 4.0MM RCL 40 (MISCELLANEOUS) ×2 IMPLANT
SET CARDIOPLEGIA MPS 5001102 (MISCELLANEOUS) ×2 IMPLANT
SPONGE LAP 18X18 X RAY DECT (DISPOSABLE) ×2 IMPLANT
SPONGE LAP 4X18 X RAY DECT (DISPOSABLE) ×2 IMPLANT
SUT BONE WAX W31G (SUTURE) ×2 IMPLANT
SUT PROLENE 3 0 SH DA (SUTURE) ×4 IMPLANT
SUT PROLENE 5 0 C 1 36 (SUTURE) ×2 IMPLANT
SUT PROLENE 6 0 C 1 30 (SUTURE) ×6 IMPLANT
SUT PROLENE 7 0 BV1 MDA (SUTURE) ×2 IMPLANT
SUT STEEL 6MS V (SUTURE) ×2 IMPLANT
SUT STEEL SZ 6 DBL 3X14 BALL (SUTURE) ×2 IMPLANT
SUT VIC AB 1 CTX 36 (SUTURE) ×3
SUT VIC AB 1 CTX36XBRD ANBCTR (SUTURE) ×3 IMPLANT
SUT VIC AB 2-0 CT1 27 (SUTURE) ×4
SUT VIC AB 2-0 CT1 TAPERPNT 27 (SUTURE) ×4 IMPLANT
SUT VIC AB 2-0 CTX 27 (SUTURE) ×2 IMPLANT
SUT VIC AB 3-0 X1 27 (SUTURE) ×10 IMPLANT
SUTURE E-PAK OPEN HEART (SUTURE) ×2 IMPLANT
SYSTEM SAHARA CHEST DRAIN ATS (WOUND CARE) ×2 IMPLANT
TAPE CLOTH SURG 4X10 WHT LF (GAUZE/BANDAGES/DRESSINGS) ×4 IMPLANT
TAPE PAPER 2X10 WHT MICROPORE (GAUZE/BANDAGES/DRESSINGS) ×2 IMPLANT
TOWEL GREEN STERILE (TOWEL DISPOSABLE) ×2 IMPLANT
TRAY FOLEY SILVER 16FR TEMP (SET/KITS/TRAYS/PACK) ×2 IMPLANT
TUBE FEEDING 8FR 16IN STR KANG (MISCELLANEOUS) ×2 IMPLANT
TUBING INSUFFLATION (TUBING) ×2 IMPLANT
UNDERPAD 30X30 (UNDERPADS AND DIAPERS) ×2 IMPLANT
WATER STERILE IRR 1000ML POUR (IV SOLUTION) ×4 IMPLANT

## 2016-11-29 NOTE — Progress Notes (Signed)
Placed patient back on full support due to arterial gas results. Dr. Prescott Gum aware.

## 2016-11-29 NOTE — Anesthesia Preprocedure Evaluation (Signed)
Anesthesia Evaluation  Patient identified by MRN, date of birth, ID band Patient awake    Reviewed: Allergy & Precautions, NPO status , Patient's Chart, lab work & pertinent test results  Airway Mallampati: I  TM Distance: >3 FB Neck ROM: Full    Dental   Pulmonary Current Smoker,    Pulmonary exam normal        Cardiovascular hypertension, Pt. on medications Normal cardiovascular exam     Neuro/Psych Depression Bipolar Disorder CVA    GI/Hepatic GERD  Medicated and Controlled,  Endo/Other    Renal/GU      Musculoskeletal   Abdominal   Peds  Hematology   Anesthesia Other Findings   Reproductive/Obstetrics                             Anesthesia Physical Anesthesia Plan  ASA: III  Anesthesia Plan: General   Post-op Pain Management:    Induction: Intravenous  PONV Risk Score and Plan: 2 and Ondansetron and Dexamethasone  Airway Management Planned: Oral ETT  Additional Equipment: Arterial line, CVP, PA Cath, TEE and Ultrasound Guidance Line Placement  Intra-op Plan:   Post-operative Plan: Post-operative intubation/ventilation  Informed Consent: I have reviewed the patients History and Physical, chart, labs and discussed the procedure including the risks, benefits and alternatives for the proposed anesthesia with the patient or authorized representative who has indicated his/her understanding and acceptance.     Plan Discussed with: CRNA and Surgeon  Anesthesia Plan Comments:         Anesthesia Quick Evaluation

## 2016-11-29 NOTE — Care Management Note (Signed)
Case Management Note  Patient Details  Name: [name] MRN: [id number] Date of Birth: 03/24/1961  Subjective/Objective:        Pt with 3 VD CAD, s/p CABG 11/28/2016. Resides with son [name].   [name] (Aunt) [name] [name] (Son) [name] [phone]        [phone]          VUY:EBXI [name]   Action/Plan: Discharge planning in process .Marland KitchenMarland KitchenCM following for disposition needs.  Expected Discharge Date:                  Expected Discharge Plan:  Home/Self Care  In-House Referral:     Discharge planning Services  CM Consult  Post Acute Care Choice:    Choice offered to:     DME Arranged:    DME Agency:     HH Arranged:    HH Agency:     Status of Service:  In process, will continue to follow  If discussed at Long Length of Stay Meetings, dates discussed:    Additional Comments:  Sharin Mons, RN 11/28/2016, 3:02 PM

## 2016-11-29 NOTE — Brief Op Note (Signed)
12/21/2016  12:06 PM  PATIENT:  [name]  56 y.o. female  PRE-OPERATIVE DIAGNOSIS:  coronary artery disease  POST-OPERATIVE DIAGNOSIS:  coronary artery disease  PROCEDURE:  Procedure(s): CORONARY ARTERY BYPASS GRAFTING (CABG) x 3, USING LEFT MAMMARY ARTERY AND RIGHT  GREATER SAPHENOUS VEIN (N/A) TRANSESOPHAGEAL ECHOCARDIOGRAM (TEE) (N/A)  LIMA-LAD SVG-OM SVG-AM  OPEN VEIN HARVEST RIGHT THIGH/BOTH LEGS SAPHENOUS VEIN EXPLORED    SURGEON:  Surgeon(s) and Role:    * Melrose Nakayama, MD - Primary  PHYSICIAN ASSISTANT: Pedram Goodchild PA-C  ANESTHESIA:   general  EBL:  Total I/O In: 2550 [I.V.:2550] Out: 400 [Urine:400]  BLOOD ADMINISTERED:none  DRAINS: ROUTINE   LOCAL MEDICATIONS USED:  NONE  SPECIMEN:  Source of Specimen:  MEDIASTINAL LYMPH NODE  DISPOSITION OF SPECIMEN:  PATHOLOGY  COUNTS:  YES  TOURNIQUET:  * No tourniquets in log *  DICTATION: .Other Dictation: Dictation Number PENDING  PLAN OF CARE: Admit to inpatient   PATIENT DISPOSITION:  ICU - intubated and hemodynamically stable.   Delay start of Pharmacological VTE agent (>24hrs) due to surgical blood loss or risk of bleeding: yes  COMPLICATIONS: NO KNOWN

## 2016-11-29 NOTE — Anesthesia Postprocedure Evaluation (Signed)
Anesthesia Post Note  Patient: [name]  Procedure(s) Performed: Procedure(s) (LRB): CORONARY ARTERY BYPASS GRAFTING (CABG) x 3, USING LEFT MAMMARY ARTERY AND RIGHT  GREATER SAPHENOUS VEIN (N/A) TRANSESOPHAGEAL ECHOCARDIOGRAM (TEE) (N/A)     Patient location during evaluation: SICU Anesthesia Type: General Level of consciousness: sedated Pain management: pain level controlled Vital Signs Assessment: post-procedure vital signs reviewed and stable Respiratory status: patient remains intubated per anesthesia plan Cardiovascular status: stable Anesthetic complications: no    Last Vitals:  Vitals:   12/24/2016 1500 12/24/2016 1515  BP:    Pulse: 89 89  Resp: 15 15  Temp: (!) 34.6 C (!) 34.8 C    Last Pain:  Vitals:   12/11/2016 1430  TempSrc: Core (Comment)                 Neetu Carrozza DAVID

## 2016-11-29 NOTE — Transfer of Care (Signed)
Immediate Anesthesia Transfer of Care Note  Patient: [name]  Procedure(s) Performed: Procedure(s): CORONARY ARTERY BYPASS GRAFTING (CABG) x 3, USING LEFT MAMMARY ARTERY AND RIGHT  GREATER SAPHENOUS VEIN (N/A) TRANSESOPHAGEAL ECHOCARDIOGRAM (TEE) (N/A)  Patient Location: SICU  Anesthesia Type:General  Level of Consciousness: Patient remains intubated per anesthesia plan  Airway & Oxygen Therapy: Patient remains intubated per anesthesia plan and Patient placed on Ventilator (see vital sign flow sheet for setting)  Post-op Assessment: Report given to RN and Post -op Vital signs reviewed and stable  Post vital signs: Reviewed and stable  Last Vitals:  Vitals:   12/14/2016 0613  BP: (!) 179/72  Pulse: 65  Resp: 20  Temp: 36.7 C    Last Pain:  Vitals:   12/20/2016 0613  TempSrc: Oral      Patients Stated Pain Goal: 0 (38/38/18 4037)  Complications: No apparent anesthesia complications

## 2016-11-29 NOTE — H&P (View-Only) (Signed)
PCP is Rogers Blocker, MD Referring Provider is Martinique, Peter M, MD  Chief Complaint  Patient presents with  . Coronary Artery Disease    Surgical eval, Cardiac Cath 11/12/16, ECHO 10/08/2016    HPI: Ms. Liming is sent for consultation for possible coronary bypass grafting.  Ms. Blue is a 56 year old woman with a history of bipolar disorder, postemetic stress disorder, depression, CVA in 2007 with no residual, endometrial cancer in 2010, reflux and hypothyroidism. She has no prior cardiac history although she was worked up for chest pain back in 2011.  He says she's been having chest pain for about the past year. On the first occasion it awakened her from her sleep. She was evaluated by Dr. Terrence Dupont.   Apparently wanted to do an invasive procedure but there was some issue with her insurance and she was treated medically. She says she got better initially.  She started having pain again this winter. She saw Dr. Marlou Sa and was referred to Dr. Oval Linsey. She complained of chest pain that often occur when she laid down to sleep. She describes this is a pressure and pain at the same time and often takes her breath away. She is also had this with exertion sometimes very minimal exertion such as walking from the bedroom to the kitchen. She says when it occurs she has to hold very still until it resolves. She's taken as many as 5 consecutive nitroglycerin before experiencing pain relief.   She does smoke, but says she's never smoked more than one half pack per day and was down to a few cigarettes a day or catheterization. She says she quit after her catheterization yesterday.  Past Medical History:  Diagnosis Date  . Bipolar 1 disorder ([place])   . Cancer [facility])    endometrial  . Chest pain   . CVA (cerebrovascular accident) ([place]) 2007  . Depression   . GERD (gastroesophageal reflux disease)   . Hypertension   . PTSD (post-traumatic stress disorder)   . Thyroid disease     Past Surgical History:   Procedure Laterality Date  . ABDOMINAL HYSTERECTOMY  07/2009  . INTRAVASCULAR PRESSURE WIRE/FFR STUDY N/A 11/12/2016   Procedure: Intravascular Pressure Wire/FFR Study;  Surgeon: Martinique, Peter M, MD;  Location: [facility];  Service: Cardiovascular;  Laterality: N/A;  . LEFT HEART CATH AND CORONARY ANGIOGRAPHY N/A 11/12/2016   Procedure: Left Heart Cath and Coronary Angiography;  Surgeon: Martinique, Peter M, MD;  Location: [facility];  Service: Cardiovascular;  Laterality: N/A;  . SALPINGOOPHORECTOMY    . TUBAL LIGATION      Family History  Problem Relation Age of Onset  . Hypertension Mother   . Heart disease Mother   . Diabetes Mother   . Prostate cancer Other   . Cancer Maternal Aunt   . CAD Sister   . CAD Brother     Social History Social History  Substance Use Topics  . Smoking status: Current Some Day Smoker    Packs/day: 0.25    Types: Cigarettes  . Smokeless tobacco: Never Used     Comment: trying to stop smoking   . Alcohol use No    Current Outpatient Prescriptions  Medication Sig Dispense Refill  . albuterol (PROVENTIL HFA;VENTOLIN HFA) 108 (90 BASE) MCG/ACT inhaler Inhale 2 puffs into the lungs every 6 (six) hours as needed for wheezing or shortness of breath.     Marland Kitchen aspirin EC 81 MG tablet Take 81 mg by mouth daily.    Marland Kitchen  beclomethasone (QVAR) 80 MCG/ACT inhaler Inhale 1 puff into the lungs 2 (two) times daily as needed (shortness of breath).     . busPIRone (BUSPAR) 5 MG tablet Take 5 mg by mouth 2 (two) times daily.    . Cholecalciferol (VITAMIN D3 PO) Take 1 capsule by mouth daily.    Marland Kitchen diltiazem (TIAZAC) 300 MG 24 hr capsule Take 300 mg by mouth daily.    . DULoxetine (CYMBALTA) 60 MG capsule Take 60 mg by mouth daily.    . ferrous gluconate (FERGON) 324 MG tablet Take 324 mg by mouth daily with breakfast.    . gabapentin (NEURONTIN) 600 MG tablet Take 600 mg by mouth 3 (three) times daily.    Marland Kitchen lamoTRIgine (LAMICTAL) 200 MG tablet Take 200 mg by  mouth every evening.     . Lurasidone HCl (LATUDA) 60 MG TABS Take 1 tablet by mouth every evening.    . meloxicam (MOBIC) 7.5 MG tablet Take 1 tablet (7.5 mg total) by mouth daily. 15 tablet 0  . methocarbamol (ROBAXIN) 500 MG tablet Take 500 mg by mouth every 6 (six) hours as needed for muscle spasms.    . nitroGLYCERIN (NITROSTAT) 0.4 MG SL tablet Place 0.4 mg under the tongue every 5 (five) minutes as needed for chest pain.    Marland Kitchen omeprazole (PRILOSEC) 40 MG capsule Take 40 mg by mouth every morning.     Marland Kitchen oxyCODONE-acetaminophen (PERCOCET) 5-325 MG tablet Take 1 tablet by mouth every 4 (four) hours as needed for moderate pain. 15 tablet 0  . QUEtiapine (SEROQUEL) 100 MG tablet Take 100 mg by mouth at bedtime.    . simvastatin (ZOCOR) 40 MG tablet Take 40 mg by mouth every evening.    . topiramate (TOPAMAX) 50 MG tablet Take 50 mg by mouth 2 (two) times daily as needed (migraines).      No current facility-administered medications for this visit.     Allergies  Allergen Reactions  . Other Anaphylaxis    Fire ants  . Latex Rash  . Penicillins Itching and Rash    Has patient had a PCN reaction causing immediate rash, facial/tongue/throat swelling, SOB or lightheadedness with hypotension: Yes Has patient had a PCN reaction causing severe rash involving mucus membranes or skin necrosis: No  Has patient had a PCN reaction that required hospitalization No Has patient had a PCN reaction occurring within the last 10 years: No If all of the above answers are "NO", then may proceed with Cephalosporin use.     Review of Systems  Constitutional: Positive for activity change and fatigue. Negative for appetite change, chills, fever and unexpected weight change.  HENT: Negative for trouble swallowing and voice change.   Eyes: Negative for visual disturbance.  Respiratory: Positive for shortness of breath. Negative for wheezing.   Cardiovascular: Positive for chest pain. Negative for palpitations  and leg swelling.  Gastrointestinal: Negative for abdominal distention, abdominal pain and blood in stool.  Genitourinary: Negative for difficulty urinating and pelvic pain.  Musculoskeletal: Positive for arthralgias, back pain, myalgias (right calf pain with walking ? claudication) and neck pain.  Neurological: Negative for dizziness, seizures, syncope, speech difficulty and weakness.       CVA 2007 R hand- no residual  Hematological: Negative for adenopathy. Does not bruise/bleed easily.  Psychiatric/Behavioral: Positive for dysphoric mood. The patient is nervous/anxious.        Bipolar, PTSD, depression  All other systems reviewed and are negative.   BP (!) 158/88  Pulse 84   Resp 20   Ht 5' 3.5" (1.613 m)   Wt 129 lb (58.5 kg)   BMI 22.49 kg/m  Physical Exam  Constitutional: She is oriented to person, place, and time. She appears well-developed and well-nourished. No distress.  HENT:  Head: Normocephalic and atraumatic.  Mouth/Throat: No oropharyngeal exudate.  Eyes: Conjunctivae and EOM are normal. No scleral icterus.  Neck: Neck supple. No thyromegaly present.  No bruits  Cardiovascular: Normal rate, regular rhythm and normal heart sounds.  Exam reveals no gallop and no friction rub.   No murmur heard. Pulmonary/Chest: Effort normal and breath sounds normal. No respiratory distress. She has no wheezes.  Abdominal: Soft. She exhibits no distension. There is no tenderness.  Musculoskeletal: She exhibits no edema or deformity.  Lymphadenopathy:    She has no cervical adenopathy.  Neurological: She is alert and oriented to person, place, and time. No cranial nerve deficit.  Motor 5/5 all 4 ext  Psychiatric: She has a normal mood and affect.  Vitals reviewed.    Diagnostic Tests: Carlton Adam Myoview 5/4 2018  The left ventricular ejection fraction is mildly decreased (45-54%).  Nuclear stress EF: 47%.  No T wave inversion was noted during stress.  There was no ST  segment deviation noted during stress.  This is an intermediate risk study.   Normal perfusion. LVEF 47% (but visually appears higher). This is an intermediate risk study due to decreased LV function. Consider clinical correlation with echo.  ECHOCARDIOGRAM 10/08/2016 Study Conclusions  - Left ventricle: The cavity size was normal. There was moderate   concentric hypertrophy. Systolic function was normal. The   estimated ejection fraction was in the range of 55% to 60%. Wall   motion was normal; there were no regional wall motion   abnormalities. Doppler parameters are consistent with abnormal   left ventricular relaxation (grade 1 diastolic dysfunction).   There was no evidence of elevated ventricular filling pressure by   Doppler parameters. - Aortic valve: Trileaflet; normal thickness leaflets. There was no   regurgitation. - Aortic root: The aortic root was normal in size. - Mitral valve: Structurally normal valve. There was mild   regurgitation. - Left atrium: The atrium was normal in size. - Right ventricle: The cavity size was normal. Wall thickness was   normal. Systolic function was normal. - Right atrium: The atrium was normal in size. - Tricuspid valve: There was moderate regurgitation. - Pulmonary arteries: Systolic pressure was moderately increased.   PA peak pressure: 43 mm Hg (S). - Inferior vena cava: The vessel was dilated. The respirophasic   diameter changes were blunted (< 50%), consistent with elevated   central venous pressure. - Pericardium, extracardiac: There was no pericardial effusion.  CARDIAC CATHETERIZATION Conclusion     Ost LAD to Prox LAD lesion, 50 %stenosed.  Mid LAD to Dist LAD lesion, 70 %stenosed.  Prox LAD lesion, 85 %stenosed.  Ost 2nd Diag to 2nd Diag lesion, 50 %stenosed.  1st Mrg lesion, 60 %stenosed.  Prox Cx to Mid Cx lesion, 50 %stenosed.  Prox RCA lesion, 90 %stenosed.  1st RPLB lesion, 50 %stenosed.  LV end  diastolic pressure is normal.   1. 3 vessel CAD    - diffuse proximal to mid LAD with more focal lesion at the second diagonal of 85%. Strongly positive FFR.   - 70% distal LAD   - 60% first OM   - diffuse 50% mid LCx   - 90% proximal RCA 2. Normal  LVEDP    I personally reviewed the cardiac cath images and concur with the findings noted above. She has significant LAD disease and a tight lesion in a small RCA. Moderate circumflex disease.  Impression: Ms. Riding is a 56 year old woman with hypertension, hyperlipidemia, a family history of coronary disease, and a light history of tobacco use who presents with chest pain. Workup included a stress Myoview which showed no evidence of ischemia but did show some LV dysfunction. An echocardiogram contradicted that in showed that her LV function was preserved. She did not have any significant valvular pathology. She then had cardiac catheterization which showed significant LAD disease, there also was disease in the small right coronary and moderate circumflex stenosis.  While some component of her chest pain would be consistent with angina, I do think her symptoms are out of proportion to the amount of disease found on catheterization. I was completely honest with her regarding this and I'm not sure that bypass grafting will completely resolve her symptoms. I do think she has sufficient coronary disease to be causing some angina and she certainly has hemodynamically significant disease in the LAD.  Coronary bypass grafting would be indicated for pain relief and preservation of myocardium. I described the procedure to her in great detail. She understands the need for general anesthesia, the incisions to be use, use of cardiac pulmonary bypass, the expected hospital stay, and the overall recovery. I informed her of the indications, risks, benefits, and alternatives. She understands the risks include, but are not limited to death, MI, DVT, PE, bleeding,  possible need for transfusion, infection, cardiac arrhythmias, respiratory or renal failure, gastrointestinal, complications, as well as the possibility of other unforeseeable complications.  She strongly wishes to proceed with coronary bypass grafting, even with the understanding that she may or may not have relief from her pain.   Plan: Coronary artery bypass grafting on Thursday, 12/15/2016   Melrose Nakayama, MD [facility] [phone]

## 2016-11-29 NOTE — Interval H&P Note (Signed)
History and Physical Interval Note:  12/10/2016 7:43 AM  [name]  has presented today for surgery, with the diagnosis of coronary artery disease  The various methods of treatment have been discussed with the patient and family. After consideration of risks, benefits and other options for treatment, the patient has consented to  Procedure(s): TRANSESOPHAGEAL ECHOCARDIOGRAM (TEE) (N/A) CORONARY ARTERY BYPASS GRAFTING (CABG)x3, EVH (N/A) as a surgical intervention .  [name] history has been reviewed, patient examined, no change in status, stable for surgery.  I have reviewed the patient's chart and labs.  Questions were answered to the patient's satisfaction.     Melrose Nakayama

## 2016-11-29 NOTE — Progress Notes (Signed)
CT surgery p.m. Rounds  Patient remains intubated because of hypercarbia during pressure support weaning Cardiac index 2.3 on renal dose dopamine Good urine output Lungs clear 6hr labs reveal hemoglobin remains stable at 7.1 We'll rest patient for 2 hours and try to attempt vent wean again

## 2016-11-29 NOTE — Anesthesia Procedure Notes (Addendum)
Central Venous Catheter Insertion Performed by: Lillia Abed, anesthesiologist Start/End07/08/2016 7:05 AM, 11/29/2016 7:15 AM Patient location: Pre-op. Preanesthetic checklist: patient identified, IV checked, risks and benefits discussed, surgical consent, monitors and equipment checked, pre-op evaluation, timeout performed and anesthesia consent Patient sedated Hand hygiene performed  and maximum sterile barriers used  Catheter size: 8.5 Fr Central line and PA cath was placed.Sheath introducer Procedure performed using ultrasound guided technique. Ultrasound Notes:anatomy identified, needle tip was noted to be adjacent to the nerve/plexus identified, no ultrasound evidence of intravascular and/or intraneural injection and image(s) printed for medical record Attempts: 1 Following insertion, line sutured and dressing applied. Post procedure assessment: blood return through all ports, free fluid flow and no air  Patient tolerated the procedure well with no immediate complications.

## 2016-11-29 NOTE — Anesthesia Procedure Notes (Signed)
Procedure Name: Intubation Date/Time: 12/12/2016 8:08 AM Performed by: [name] Pre-anesthesia Checklist: Timeout performed, Patient being monitored, Suction available, Emergency Drugs available and Patient identified Patient Re-evaluated:Patient Re-evaluated prior to inductionOxygen Delivery Method: Circle system utilized Preoxygenation: Pre-oxygenation with 100% oxygen Intubation Type: IV induction Ventilation: Mask ventilation without difficulty and Oral airway inserted - appropriate to patient size Laryngoscope Size: Mac and 3 Grade View: Grade I Tube type: Oral Tube size: 8.0 mm Number of attempts: 1 Placement Confirmation: breath sounds checked- equal and bilateral,  ETT inserted through vocal cords under direct vision and positive ETCO2 Secured at: 22 cm Tube secured with: Tape Dental Injury: Teeth and Oropharynx as per pre-operative assessment

## 2016-11-29 NOTE — Anesthesia Procedure Notes (Signed)
Arterial Line Insertion Start/End07/12/2016 7:25 AM, 11/29/2016 7:44 AM Performed by: Neldon Newport, CRNA  Patient location: Pre-op. Left, radial was placed Catheter size: 20 G Hand hygiene performed  and maximum sterile barriers used   Attempts: 2 Procedure performed without using ultrasound guided technique. Following insertion, dressing applied. Post procedure assessment: normal  Patient tolerated the procedure well with no immediate complications.

## 2016-11-29 NOTE — Progress Notes (Signed)
  Echocardiogram 2D Echocardiogram has been performed.  Donata Clay 12/11/2016, 11:40 AM

## 2016-11-29 NOTE — Progress Notes (Addendum)
Pharmacy Antibiotic Note  [name] is a 56 y.o. female admitted on 12/15/2016 with surgical prophylaxis.  Pharmacy has been consulted for vancomycin dosing.  Patient s/p CABG this am, wbc was within normal limits prior to surgery, renal function normal.  Plan: Vancomycin 750mg  IV every 12  Hours x 2 doses post op to provide 24 hours of coverage.  Goal trough 10-15 mcg/mL.    Height: 5\' 3"  (160 cm) Weight: 121 lb 6 oz (55.1 kg) IBW/kg (Calculated) : 52.4  Temp (24hrs), Avg:96.2 F (35.7 C), Min:94.3 F (34.6 C), Max:98 F (36.7 C)   Recent Labs Lab 11/26/16 1353 11/25/2016 0819 12/05/2016 1009 12/04/2016 1037 12/13/2016 1139 12/03/2016 1223  WBC 7.5  --   --   --   --   --   CREATININE 0.83 0.70 0.60 0.30* 0.50 0.40*    Estimated Creatinine Clearance: 65 mL/min (A) (by C-G formula based on SCr of 0.4 mg/dL (L)).    Allergies  Allergen Reactions  . Other Anaphylaxis    Fire ants  . Penicillins Itching, Rash and Other (See Comments)    PATIENT HAD A PCN REACTION WITH IMMEDIATE RASH, FACIAL/TONGUE/THROAT SWELLING, SOB, OR LIGHTHEADEDNESS WITH HYPOTENSION:  #  #  #  YES  #  #  #   Has patient had a PCN reaction causing severe rash involving mucus membranes or skin necrosis: No  Has patient had a PCN reaction that required hospitalization No Has patient had a PCN reaction occurring within the last 10 years: No If all of the above answers are "NO", then may proceed with Cephalosporin use.   . Latex Rash    Thank you for allowing pharmacy to be a part of this patient's care.  Erin Hearing PharmD., BCPS Clinical Pharmacist Pager [phone] 11/25/2016 1:53 PM

## 2016-11-29 NOTE — Anesthesia Procedure Notes (Signed)
Central Venous Catheter Insertion PA cath was placed.

## 2016-11-30 ENCOUNTER — Encounter (HOSPITAL_COMMUNITY): Payer: Self-pay | Admitting: Thoracic Surgery (Cardiothoracic Vascular Surgery)

## 2016-11-30 ENCOUNTER — Inpatient Hospital Stay (HOSPITAL_COMMUNITY): Payer: PPO

## 2016-11-30 LAB — GLUCOSE, CAPILLARY
GLUCOSE-CAPILLARY: 103 mg/dL — AB (ref 65–99)
GLUCOSE-CAPILLARY: 107 mg/dL — AB (ref 65–99)
GLUCOSE-CAPILLARY: 108 mg/dL — AB (ref 65–99)
GLUCOSE-CAPILLARY: 119 mg/dL — AB (ref 65–99)
Glucose-Capillary: 106 mg/dL — ABNORMAL HIGH (ref 65–99)
Glucose-Capillary: 144 mg/dL — ABNORMAL HIGH (ref 65–99)
Glucose-Capillary: 133 mg/dL — ABNORMAL HIGH (ref 65–99)

## 2016-11-30 LAB — POCT I-STAT 3, ART BLOOD GAS (G3+)
ACID-BASE DEFICIT: 6 mmol/L — AB (ref 0.0–2.0)
Acid-base deficit: 2 mmol/L (ref 0.0–2.0)
BICARBONATE: 21.1 mmol/L (ref 20.0–28.0)
Bicarbonate: 22.9 mmol/L (ref 20.0–28.0)
Bicarbonate: 22.5 mmol/L (ref 20.0–28.0)
O2 Saturation: 97 %
O2 Saturation: 97 %
O2 Saturation: 99 %
PH ART: 7.22 — AB (ref 7.350–7.450)
Patient temperature: 37.8
TCO2: 24 mmol/L (ref 0–100)
TCO2: 23 mmol/L (ref 0–100)
TCO2: 24 mmol/L (ref 0–100)
pCO2 arterial: 30.9 mmHg — ABNORMAL LOW (ref 32.0–48.0)
pCO2 arterial: 38.5 mmHg (ref 32.0–48.0)
pCO2 arterial: 51.9 mmHg — ABNORMAL HIGH (ref 32.0–48.0)
pH, Arterial: 7.482 — ABNORMAL HIGH (ref 7.350–7.450)
pH, Arterial: 7.376 (ref 7.350–7.450)
pO2, Arterial: 108 mmHg (ref 83.0–108.0)
pO2, Arterial: 137 mmHg — ABNORMAL HIGH (ref 83.0–108.0)
pO2, Arterial: 99 mmHg (ref 83.0–108.0)

## 2016-11-30 LAB — CBC
HEMATOCRIT: 20.5 % — AB (ref 36.0–46.0)
HEMATOCRIT: 23.9 % — AB (ref 36.0–46.0)
HEMOGLOBIN: 6.6 g/dL — AB (ref 12.0–15.0)
Hemoglobin: 7.7 g/dL — ABNORMAL LOW (ref 12.0–15.0)
MCH: 26.5 pg (ref 26.0–34.0)
MCH: 26.3 pg (ref 26.0–34.0)
MCHC: 32.2 g/dL (ref 30.0–36.0)
MCHC: 32.2 g/dL (ref 30.0–36.0)
MCV: 82.3 fL (ref 78.0–100.0)
MCV: 81.6 fL (ref 78.0–100.0)
Platelets: 107 10*3/uL — ABNORMAL LOW (ref 150–400)
Platelets: 99 10*3/uL — ABNORMAL LOW (ref 150–400)
RBC: 2.49 MIL/uL — AB (ref 3.87–5.11)
RBC: 2.93 MIL/uL — ABNORMAL LOW (ref 3.87–5.11)
RDW: 16.7 % — ABNORMAL HIGH (ref 11.5–15.5)
RDW: 15.8 % — AB (ref 11.5–15.5)
WBC: 10.6 10*3/uL — AB (ref 4.0–10.5)
WBC: 15.2 10*3/uL — ABNORMAL HIGH (ref 4.0–10.5)

## 2016-11-30 LAB — BASIC METABOLIC PANEL
Anion gap: 5 (ref 5–15)
BUN: 9 mg/dL (ref 6–20)
CHLORIDE: 112 mmol/L — AB (ref 101–111)
CO2: 23 mmol/L (ref 22–32)
Calcium: 8.3 mg/dL — ABNORMAL LOW (ref 8.9–10.3)
Creatinine, Ser: 0.71 mg/dL (ref 0.44–1.00)
GFR calc non Af Amer: >60 mL/min (ref >60)
Glucose, Bld: 105 mg/dL — ABNORMAL HIGH (ref 65–99)
POTASSIUM: 3.6 mmol/L (ref 3.5–5.1)
SODIUM: 140 mmol/L (ref 135–145)

## 2016-11-30 LAB — POCT I-STAT, CHEM 8
BUN: 12 mg/dL (ref 6–20)
Calcium, Ion: 1.22 mmol/L (ref 1.15–1.40)
Chloride: 103 mmol/L (ref 101–111)
Creatinine, Ser: 0.8 mg/dL (ref 0.44–1.00)
GLUCOSE: 112 mg/dL — AB (ref 65–99)
HCT: 26 % — ABNORMAL LOW (ref 36.0–46.0)
Hemoglobin: 8.8 g/dL — ABNORMAL LOW (ref 12.0–15.0)
POTASSIUM: 4.2 mmol/L (ref 3.5–5.1)
Sodium: 140 mmol/L (ref 135–145)
TCO2: 22 mmol/L (ref 0–100)

## 2016-11-30 LAB — MAGNESIUM
MAGNESIUM: 2.3 mg/dL (ref 1.7–2.4)
MAGNESIUM: 2.2 mg/dL (ref 1.7–2.4)

## 2016-11-30 LAB — CREATININE, SERUM
Creatinine, Ser: 0.85 mg/dL (ref 0.44–1.00)
GFR calc Af Amer: >60 mL/min (ref >60)

## 2016-11-30 LAB — PREPARE RBC (CROSSMATCH)

## 2016-11-30 MED ORDER — POTASSIUM CHLORIDE 10 MEQ/50ML IV SOLN
10.0000 meq | INTRAVENOUS | Status: AC
  Administered 2016-11-30 (×3): 10 meq via INTRAVENOUS
  Filled 2016-11-30 (×3): qty 50

## 2016-11-30 MED ORDER — LEVALBUTEROL HCL 0.63 MG/3ML IN NEBU
0.6300 mg | INHALATION_SOLUTION | Freq: Three times a day (TID) | RESPIRATORY_TRACT | Status: DC
  Administered 2016-11-30: 0.63 mg via RESPIRATORY_TRACT
  Filled 2016-11-30: qty 3

## 2016-11-30 MED ORDER — ENOXAPARIN SODIUM 40 MG/0.4ML ~~LOC~~ SOLN
40.0000 mg | SUBCUTANEOUS | Status: DC
  Administered 2016-11-30 – 2016-12-01 (×2): 40 mg via SUBCUTANEOUS
  Filled 2016-11-30 (×2): qty 0.4

## 2016-11-30 MED ORDER — LEVALBUTEROL HCL 0.63 MG/3ML IN NEBU
0.6300 mg | INHALATION_SOLUTION | Freq: Two times a day (BID) | RESPIRATORY_TRACT | Status: DC
  Administered 2016-11-30 – 2016-12-01 (×2): 0.63 mg via RESPIRATORY_TRACT
  Filled 2016-11-30 (×2): qty 3

## 2016-11-30 MED ORDER — SODIUM BICARBONATE 8.4 % IV SOLN
50.0000 meq | Freq: Once | INTRAVENOUS | Status: AC
  Administered 2016-11-30: 50 meq via INTRAVENOUS

## 2016-11-30 MED ORDER — ALBUMIN HUMAN 5 % IV SOLN
12.5000 g | Freq: Once | INTRAVENOUS | Status: DC | PRN
  Filled 2016-11-30: qty 250

## 2016-11-30 MED ORDER — SODIUM CHLORIDE 0.9 % IV SOLN: Freq: Once | INTRAVENOUS | Status: AC

## 2016-11-30 MED ORDER — ORAL CARE MOUTH RINSE
15.0000 mL | Freq: Two times a day (BID) | OROMUCOSAL | Status: DC
  Administered 2016-12-01: 15 mL via OROMUCOSAL

## 2016-11-30 MED ORDER — INSULIN ASPART 100 UNIT/ML ~~LOC~~ SOLN
0.0000 [IU] | SUBCUTANEOUS | Status: DC
  Administered 2016-11-30 (×2): 2 [IU] via SUBCUTANEOUS
  Administered 2016-12-01: 4 [IU] via SUBCUTANEOUS

## 2016-11-30 MED ORDER — FUROSEMIDE 10 MG/ML IJ SOLN
40.0000 mg | Freq: Once | INTRAMUSCULAR | Status: AC
  Administered 2016-11-30: 40 mg via INTRAVENOUS
  Filled 2016-11-30: qty 4

## 2016-11-30 MED FILL — Sodium Bicarbonate IV Soln 8.4%: INTRAVENOUS | Qty: 50 | Status: AC

## 2016-11-30 MED FILL — Heparin Sodium (Porcine) Inj 1000 Unit/ML: INTRAMUSCULAR | Qty: 10 | Status: AC

## 2016-11-30 MED FILL — Sodium Chloride IV Soln 0.9%: INTRAVENOUS | Qty: 2000 | Status: AC

## 2016-11-30 MED FILL — Electrolyte-R (PH 7.4) Solution: INTRAVENOUS | Qty: 3000 | Status: AC

## 2016-11-30 MED FILL — Lidocaine HCl IV Inj 20 MG/ML: INTRAVENOUS | Qty: 5 | Status: AC

## 2016-11-30 NOTE — Op Note (Signed)
[name], [name]               ACCOUNT NO.:  [id number]  MEDICAL RECORD NO.:  [id number]  LOCATION:  [facility]                         FACILITY:  [facility]  PHYSICIAN:  Revonda Standard. Roxan Hockey, M.D.DATE OF BIRTH:  1961/04/06  DATE OF PROCEDURE:  12/24/2016 DATE OF DISCHARGE:                              OPERATIVE REPORT   PREOPERATIVE DIAGNOSIS:  Three-vessel coronary artery disease.  POSTOPERATIVE DIAGNOSIS:  Three-vessel coronary artery disease.  PROCEDURES:  Median sternotomy, extracorporeal circulation, coronary artery bypass grafting x3 (left internal mammary artery to left anterior descending, saphenous vein graft to first obtuse marginal, saphenous vein graft to acute marginal).  Open vein harvest, right thigh.  SURGEON:  Revonda Standard. Roxan Hockey, M.D.  ASSISTANT:  John Giovanni, P.A.-C.  ANESTHESIA:  General.  FINDINGS:  Transesophageal echocardiography showed left ventricular hypertrophy, preserved left ventricular systolic function, mild mitral regurgitation. Targets good-quality. Conduits small, fair-quality. Postbypass transesophageal echocardiography showed septal dyskinesis secondary to pacing.  Moderate tricuspid regurgitation.  Mild mitral regurgitation.  CLINICAL NOTE:  [name] is a 56 year old woman with multiple cardiac risk factors, who presented with chest pain.  At catheterization, she was found to have three-vessel coronary artery disease, but her chest pain did seem out of proportion to the degree and severity of her CAD. She was warned that the coronary artery disease might not be the cause of her pain, but was strongly insistent on having surgery.  The indications, risks, benefits and alternatives as well as expected outcomes from the procedure were discussed in detail with the patient. She understood and accepted the risks and agreed to proceed.  OPERATIVE NOTE:  [name] was brought to the preoperative holding area on November 29, 2016.  Anesthesia placed a  Swan-Ganz catheter and an arterial blood pressure monitoring line.  She was taken to the operating room, anesthetized and intubated.  Intravenous antibiotics were administered. Transesophageal echocardiography was performed by Dr. Lillia Abed, findings as previously noted.  The chest, abdomen and legs were prepped and draped in usual sterile fashion.  A median sternotomy was performed.  The sternum was relatively narrow, thin and had evidence of osteoporosis.  The left internal mammary artery was harvested in standard fashion.  It was a very small vessel, which bifurcated relatively high and beyond the bifurcation was far too small to use as a bypass graft.  Simultaneously with internal mammary harvest, an incision was made in the medial aspect of the right leg, the greater saphenous vein was identified just below the knee, but was very small. Incision was made in a similar location on the left leg with a similar finding.  Incisions were made in both legs just above the knee and again the greater saphenous vein was extremely small both those sites. Finally, a segment of greater saphenous vein was harvested from the upper thigh using a bridged open technique.  The saphenous vein there was small, but was usable as a conduit.  There was a very limited amount of conduit available for grafting.  2000 units of heparin was administered during the vessel harvest.  The remainder of the full heparin dose was given prior to opening the pericardium.  Of note, the left mammary artery  did accept a 1.5-mm probe and did have brisk flow despite its relatively small size.  The pericardium was opened.  The ascending aorta was inspected.  There was no evidence of atherosclerotic disease.  The aorta was cannulated via concentric 2-0 Ethibond pledgeted pursestring sutures.  A dual-stage venous cannula was placed via pursestring suture in right atrial appendage.  Cardiopulmonary bypass was initiated.  Flows  were maintained per protocol.  The patient was cooled to 32 degrees Celsius.  The coronary arteries were inspected and anastomotic sites were chosen.  The conduits were inspected and cut to length.  A foam pad was placed in the pericardium to insulate the heart.  A temperature probe was placed in the myocardial septum and a cardioplegia cannula was placed in the ascending aorta.  The aorta was crossclamped.  The left ventricle was emptied via the aortic root vent.  Cardiac arrest then was achieved with a combination of cold antegrade blood cardioplegia and topical iced saline.  1.5 L of cardioplegia was administered.  There was a rapid diastolic arrest and septal cooling to 10 degrees Celsius.  Additional cardioplegia was administered at the completion of each vein graft.  A reversed saphenous vein graft was placed end-to-side to the first obtuse marginal branch of the circumflex.  This was a high anterolateral branch, and was partially intramyocardial.  It was a good-quality target. The vein was fair-quality, it was anastomosed end-to-side with a running 7-0 Prolene suture.  All anastomoses were probed proximally and distally at their completion to ensure patency.  Cardioplegia was administered down the vein and there was good flow and good hemostasis.  Next, a reversed saphenous vein graft was placed end-to-side to an acute marginal branch of the right coronary.  The right coronary had a tight ostial stenosis, it was a non-dominant vessel, relatively small in caliber.  The acute marginal was selected as there was adequate vein to reach it, but not the distal right coronary itself.  There was some plaque in the vessel, but overall was good-quality targets at the site of the anastomosis.  The end-to-side anastomosis was performed with a running 7-0 Prolene suture.  Af the completion of the anastomosis, cardioplegia was administered via the vein.  There were good flow and good  hemostasis.  The left internal mammary artery was brought through a window in the pericardium.  The artery had previously been beveled.  The site selected for anastomosis on the LAD was in the midportion of the vessel, as far distal as the graft could reach without undue tension.  An arteriotomy was made in the LAD.  A 1.5-mm probe did pass to the apex. An end-to-side anastomosis was performed with a running 8-0 Prolene suture.  At the completion of the anastomosis, the bulldog clamp was removed from the mammary artery and septal rewarming was noted.  The bulldog clamp was replaced.  The mammary pedicle was tacked to the epicardial surface of the heart with 6-0 Prolene sutures.  Additional cardioplegia was administered via the vein grafts and the aortic root. The vein grafts were cut to length and the proximal anastomoses were performed to 4.0 mm punch aortotomies with running 6-0 Prolene sutures. At the completion of the final proximal anastomosis, lidocaine was administered, the aortic root was de-aired.  The patient was placed in Trendelenburg position.  The left ventricle and aortic root were de- aired and the aortic crossclamp was removed, the total crossclamp time was 67 minutes.  The patient required a single defibrillation  with 10 joules and then was in a bradycardic rhythm thereafter.  While rewarming was completed, all proximal and distal anastomoses were inspected for hemostasis.  Epicardial pacing wires were placed on the right ventricle and right atrium.  When the patient had rewarmed to a core temperature of 37 degrees Celsius, she was weaned from cardiopulmonary bypass.  The total bypass time was 95 minutes.  The initial cardiac index was low as her prebypass index had been, improved with the volume administration, 3 mcg/kg/min of dopamine was started as a continuous infusion and the cardiac index was greater than 2 L/min/m2.  Postbypass transesophageal echocardiography  showed some septal dyskinesis associated with DDD pacing.  There was moderate tricuspid regurgitation, which did appear to be improving during the postbypass period.  There was no change in the mild mitral regurgitation.  Overall, left ventricular wall motion was preserved.  A test dose of protamine was administered and was well tolerated.  The atrial and aortic cannulae were removed.  The remainder of the protamine was administered without incident.  The chest was irrigated with warm saline.  Hemostasis was achieved.  Left pleural and mediastinal chest tubes were placed through separate subcostal incisions.  The sternum was closed with interrupted single and double heavy-gauge stainless steel wires.  The pectoralis fascia, subcutaneous tissue and skin were closed in standard fashion.  All sponge, needle and instrument counts were correct at the end of the procedure.  The patient was taken from the operating room to the Surgical Intensive Care Unit, intubated and in stable condition.     Revonda Standard Roxan Hockey, M.D.     SCH/MEDQ  D:  12/12/2016  T:  11/30/2016  Job:  [id number]

## 2016-11-30 NOTE — Procedures (Signed)
Extubation Procedure Note  Patient Details:   Name: [name] DOB: [birth year] MRN: [id number]   Airway Documentation:     Evaluation  O2 sats: stable throughout Complications: No apparent complications Patient did tolerate procedure well. Bilateral Breath Sounds: Clear   Yes  Patient tolerated wean. NIF-22 and VC 0.5L. Positive for cuff leak. Patient extubated to a 3 Lpm [place]. No signs of dyspnea or stridor. Patient instructed on the Incentive Spirometer achieving 600 mL five times. Patient resting comfortably. RN at beside.   [name] 11/30/2016, 9:12 AM

## 2016-11-30 NOTE — Progress Notes (Signed)
1 Day Post-Op Procedure(s) (LRB): CORONARY ARTERY BYPASS GRAFTING (CABG) x 3, USING LEFT MAMMARY ARTERY AND RIGHT  GREATER SAPHENOUS VEIN (N/A) TRANSESOPHAGEAL ECHOCARDIOGRAM (TEE) (N/A) Subjective: Intubated but alert and cooperative  Objective: Vital signs in last 24 hours: Temp:  [93.7 F (34.3 C)-100.2 F (37.9 C)] 100 F (37.8 C) (07/06 0700) Pulse Rate:  [70-89] 79 (07/06 0700) Cardiac Rhythm: Atrial paced (07/05 1920) Resp:  [12-24] 16 (07/06 0700) BP: (99-144)/(61-79) 144/79 (07/06 0700) SpO2:  [99 %-100 %] 100 % (07/06 0700) Arterial Line BP: (108-192)/(51-84) 177/67 (07/06 0700) FiO2 (%):  [40 %-50 %] 40 % (07/06 0600) Weight:  [127 lb (57.6 kg)] 127 lb (57.6 kg) (07/06 0600)  Hemodynamic parameters for last 24 hours: PAP: (19-46)/(10-23) 27/13 CO:  [2.4 L/min-4.8 L/min] 4 L/min CI:  [1.6 L/min/m2-3.1 L/min/m2] 2.5 L/min/m2  Intake/Output from previous day: 07/05 0701 - 07/06 0700 In: 7333.8 [I.V.:4729.8; Blood:144; IV Piggyback:2400] Out: 9417 [Urine:5075; Blood:650; Chest Tube:470] Intake/Output this shift: No intake/output data recorded.  General appearance: alert, cooperative and no distress Neurologic: follows commands, moves all 4 Heart: regular rate and rhythm Lungs: coarse BS bilaterally Abdomen: normal findings: soft, non-tender  Lab Results:  Recent Labs  11/25/2016 1934 11/30/16 0325  WBC 10.4 10.6*  HGB 7.1* 6.6*  HCT 22.1* 20.5*  PLT 125* 107*   BMET:  Recent Labs  12/10/2016 1927 12/19/2016 1934 11/30/16 0325  NA 143  --  140  K 4.5  --  3.6  CL 109  --  112*  CO2  --   --  23  GLUCOSE 104*  --  105*  BUN 11  --  9  CREATININE 0.60 0.68 0.71  CALCIUM  --   --  8.3*    PT/INR:  Recent Labs  12/18/2016 1342  LABPROT 21.3*  INR 1.82   ABG    Component Value Date/Time   PHART 7.238 (L) 12/25/2016 2103   HCO3 23.0 12/17/2016 2103   TCO2 25 12/06/2016 2103   ACIDBASEDEF 4.0 (H) 12/19/2016 2103   O2SAT 97.0 12/08/2016 2103    CBG (last 3)   Recent Labs  12/23/2016 2257 11/30/16 0000 11/30/16 0320  GLUCAP 106* 103* 107*    Assessment/Plan: S/P Procedure(s) (LRB): CORONARY ARTERY BYPASS GRAFTING (CABG) x 3, USING LEFT MAMMARY ARTERY AND RIGHT  GREATER SAPHENOUS VEIN (N/A) TRANSESOPHAGEAL ECHOCARDIOGRAM (TEE) (N/A) -POD # 1   CV - stable, in SR, good CO- wean dopamine  ECG OK  ASA, statin, beta blocker  RESP_ left on vent overnight- rapid wean this AM  RENAL- creatinine normal, diurese  ENDO_ CBG well controlled  Anemia secondary to ABL- transfuse one unit  SCD + enoxaparin for DVT prophylaxis  Once extubated will dc swan, chest tubes and mobilize   LOS: 1 day    Melrose Nakayama 11/30/2016

## 2016-11-30 NOTE — Progress Notes (Signed)
MD paged with ABG results following second attempt to wean pt off ventilator.  Verbal order given for one amp of bicarb, no vent changes at this time, wean precedex and attempt to wean off ventilator again in the morning.

## 2016-11-30 NOTE — Progress Notes (Signed)
Attempted to wean patient from the ventilator three times during the night. With each attempt patients spontaneous tidal volumes remained very low (150-228ml) placed patient back on full support.

## 2016-11-30 NOTE — Progress Notes (Signed)
RT notified that pt off precedex drip and ready to attempt weaning from ventilator

## 2016-11-30 NOTE — Progress Notes (Signed)
      [address]       [place] [address]             [phone]      Sleeping  BP (!) 103/58   Pulse 78   Temp 99.7 F (37.6 C)   Resp (!) 24   Ht 5\' 3"  (1.6 m)   Wt 127 lb (57.6 kg)   SpO2 100%   BMI 22.50 kg/m    Intake/Output Summary (Last 24 hours) at 11/30/16 2122 Last data filed at 11/30/16 2100  Gross per 24 hour  Intake          2351.73 ml  Output             2305 ml  Net            46.73 ml   Creatinine normal K= 4.2  Hgb 7.7 after transfusion for acute anemia secondary to ABL  Remo Lipps C. Roxan Hockey, MD [facility] [phone]

## 2016-12-01 ENCOUNTER — Inpatient Hospital Stay (HOSPITAL_COMMUNITY): Payer: PPO

## 2016-12-01 ENCOUNTER — Inpatient Hospital Stay (HOSPITAL_COMMUNITY): Payer: PPO | Admitting: Anesthesiology

## 2016-12-01 LAB — CBC
HEMATOCRIT: 26.1 % — AB (ref 36.0–46.0)
HEMOGLOBIN: 8.6 g/dL — AB (ref 12.0–15.0)
MCH: 27.2 pg (ref 26.0–34.0)
MCHC: 33 g/dL (ref 30.0–36.0)
MCV: 82.6 fL (ref 78.0–100.0)
Platelets: 101 10*3/uL — ABNORMAL LOW (ref 150–400)
RBC: 3.16 MIL/uL — AB (ref 3.87–5.11)
RDW: 16.3 % — ABNORMAL HIGH (ref 11.5–15.5)
WBC: 17.3 10*3/uL — ABNORMAL HIGH (ref 4.0–10.5)

## 2016-12-01 LAB — GLUCOSE, CAPILLARY
GLUCOSE-CAPILLARY: 147 mg/dL — AB (ref 65–99)
Glucose-Capillary: 106 mg/dL — ABNORMAL HIGH (ref 65–99)
Glucose-Capillary: 155 mg/dL — ABNORMAL HIGH (ref 65–99)
Glucose-Capillary: 97 mg/dL (ref 65–99)

## 2016-12-01 LAB — POCT I-STAT 3, ART BLOOD GAS (G3+)
ACID-BASE DEFICIT: 4 mmol/L — AB (ref 0.0–2.0)
ACID-BASE DEFICIT: 7 mmol/L — AB (ref 0.0–2.0)
BICARBONATE: 22.3 mmol/L (ref 20.0–28.0)
BICARBONATE: 22.6 mmol/L (ref 20.0–28.0)
O2 SAT: 16 %
O2 SAT: 42 %
TCO2: 24 mmol/L (ref 0–100)
TCO2: 25 mmol/L (ref 0–100)
pCO2 arterial: 55.2 mmHg — ABNORMAL HIGH (ref 32.0–48.0)
pCO2 arterial: 83.5 mmHg (ref 32.0–48.0)
pH, Arterial: 7.213 — ABNORMAL LOW (ref 7.350–7.450)
pH, Arterial: 7.039 — CL (ref 7.350–7.450)
pO2, Arterial: 29 mmHg — CL (ref 83.0–108.0)
pO2, Arterial: 20 mmHg — CL (ref 83.0–108.0)

## 2016-12-01 LAB — BASIC METABOLIC PANEL
ANION GAP: 7 (ref 5–15)
BUN: 16 mg/dL (ref 6–20)
CHLORIDE: 106 mmol/L (ref 101–111)
CO2: 23 mmol/L (ref 22–32)
Calcium: 8.6 mg/dL — ABNORMAL LOW (ref 8.9–10.3)
Creatinine, Ser: 1.14 mg/dL — ABNORMAL HIGH (ref 0.44–1.00)
GFR calc non Af Amer: 53 mL/min — ABNORMAL LOW (ref >60)
Glucose, Bld: 108 mg/dL — ABNORMAL HIGH (ref 65–99)
Potassium: 4.2 mmol/L (ref 3.5–5.1)
Sodium: 136 mmol/L (ref 135–145)

## 2016-12-01 IMAGING — DX DG CHEST 1V PORT
1 series · 1 of 1 positions shown · non-contrast
Comparison: Earlier today at [WM] hours.

CLINICAL DATA: Status post code.

EXAM:
PORTABLE CHEST 1 VIEW

[chest]
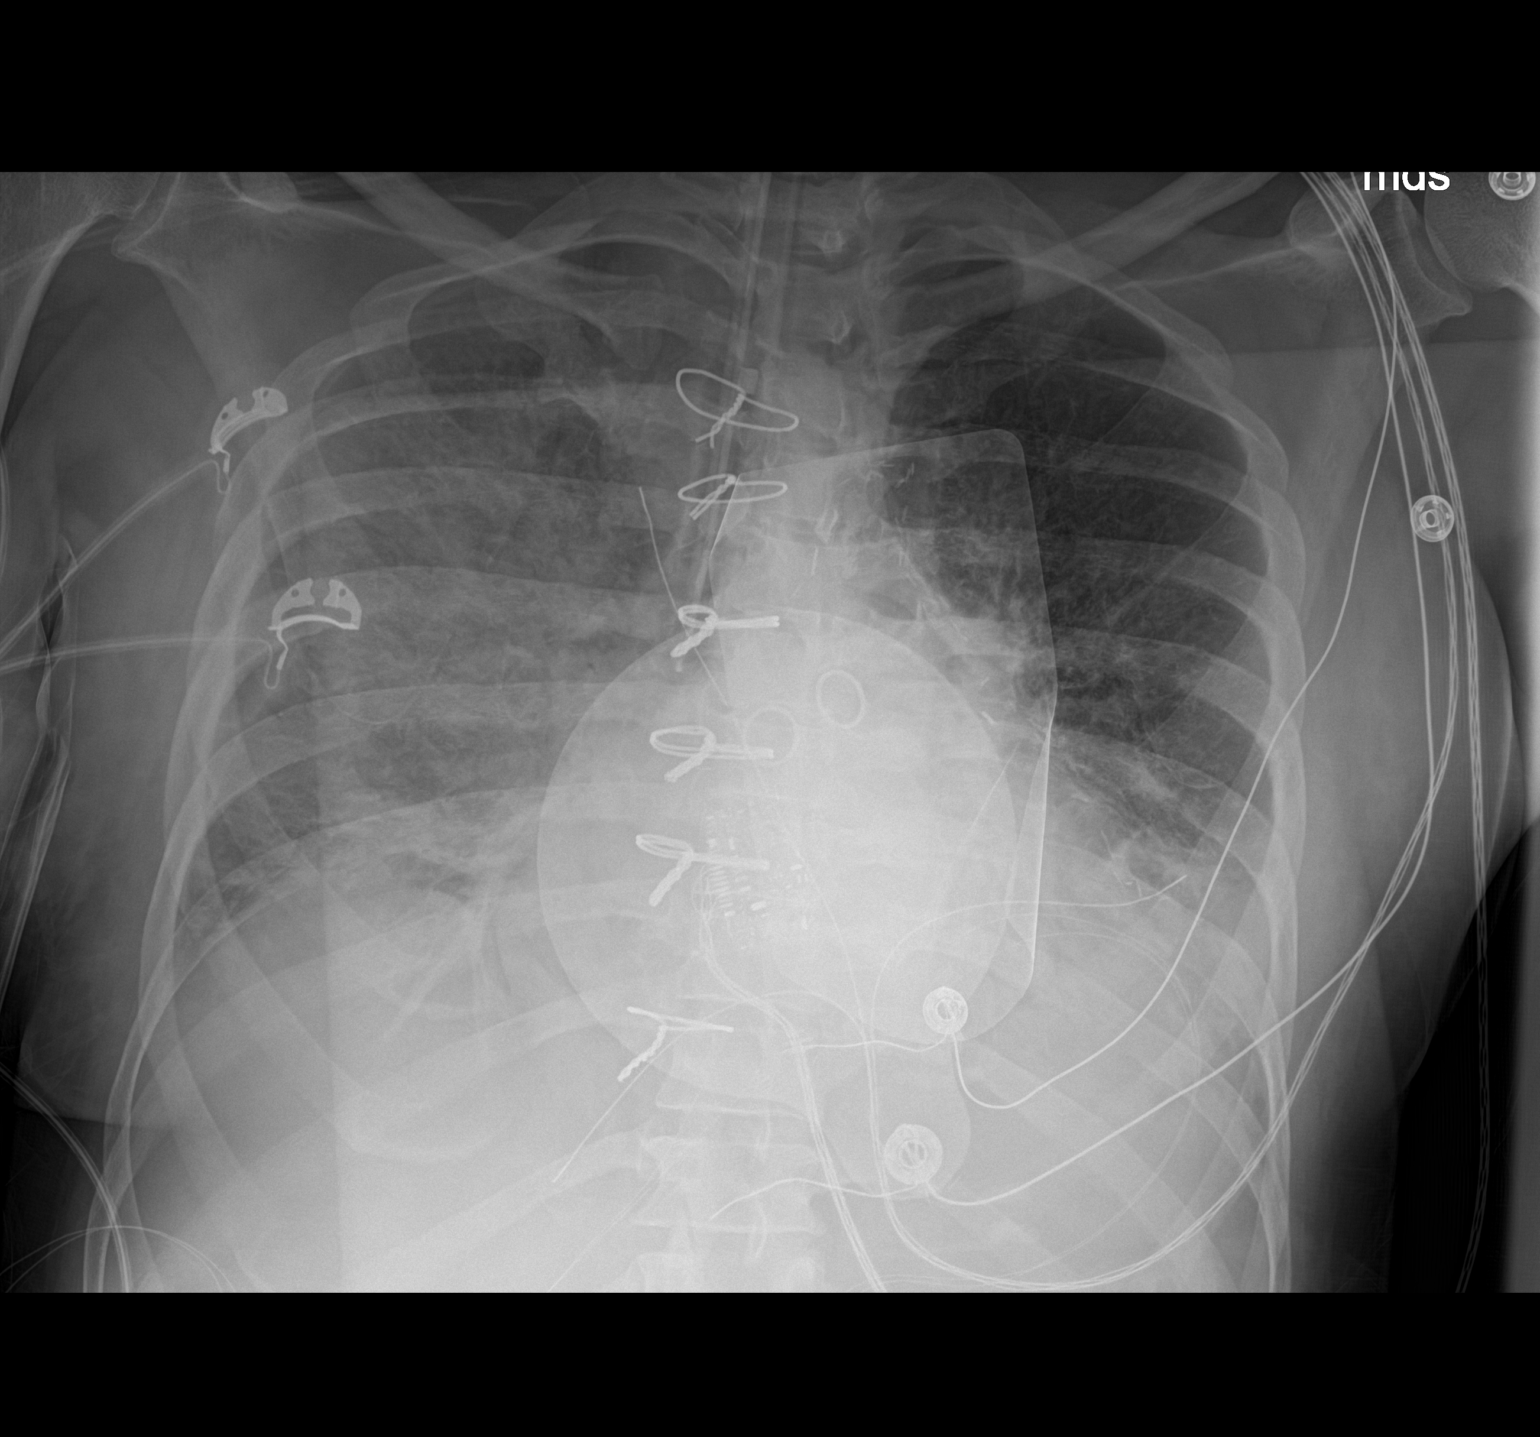

[1 of 1 positions shown; findings below may reference images not displayed]

FINDINGS: [WM] hours. Overlying external pacer/defibrillator. Prior median
sternotomy. Interval intubation. Endotracheal tube is somewhat
obscured by overlying hardware. Likely terminates either at the
carina or entering the right mainstem bronchus. Normal heart size
for level of inspiration. Probable small bilateral pleural
effusions. No pneumothorax. Right-sided interstitial and airspace
disease is progressive. Persistent left base atelectasis. Removal of
right IJ Cordis sheath.
IMPRESSION: Placement of endotracheal tube which is difficult to visualize
centrally secondary to extensive overlying support apparatus. Felt
to be either at the carina or entering the right mainstem bronchus.
Consider retraction 4-5 cm.

Worsened right-sided aeration which could represent asymmetric
pulmonary edema or aspiration.

Similar trace left pleural fluid with adjacent atelectasis.

## 2016-12-01 MED ORDER — INSULIN ASPART 100 UNIT/ML ~~LOC~~ SOLN: 0.0000 [IU] | Freq: Three times a day (TID) | SUBCUTANEOUS | Status: DC

## 2016-12-01 MED ORDER — SODIUM CHLORIDE 0.9 % IV SOLN: 250.0000 mL | INTRAVENOUS | Status: DC | PRN

## 2016-12-01 MED ORDER — SODIUM CHLORIDE 0.9% FLUSH
3.0000 mL | INTRAVENOUS | Status: DC | PRN
Start: 2016-12-01 — End: 2016-12-02

## 2016-12-01 MED ORDER — TOPIRAMATE 25 MG PO TABS
50.0000 mg | ORAL_TABLET | Freq: Two times a day (BID) | ORAL | Status: DC | PRN
  Filled 2016-12-01: qty 2

## 2016-12-01 MED ORDER — FERROUS GLUCONATE 324 (38 FE) MG PO TABS
324.0000 mg | ORAL_TABLET | Freq: Every day | ORAL | Status: DC
  Administered 2016-12-01: 324 mg via ORAL
  Filled 2016-12-01 (×2): qty 1

## 2016-12-01 MED ORDER — MELOXICAM 7.5 MG PO TABS
7.5000 mg | ORAL_TABLET | Freq: Every day | ORAL | Status: DC | PRN
  Filled 2016-12-01: qty 1

## 2016-12-01 MED ORDER — ALUM & MAG HYDROXIDE-SIMETH 200-200-20 MG/5ML PO SUSP: 15.0000 mL | Freq: Four times a day (QID) | ORAL | Status: DC | PRN

## 2016-12-01 MED ORDER — POTASSIUM CHLORIDE ER 10 MEQ PO TBCR
20.0000 meq | EXTENDED_RELEASE_TABLET | Freq: Every day | ORAL | Status: DC
  Administered 2016-12-01: 20 meq via ORAL
  Filled 2016-12-01 (×2): qty 2

## 2016-12-01 MED ORDER — SODIUM CHLORIDE 0.9% FLUSH
3.0000 mL | Freq: Two times a day (BID) | INTRAVENOUS | Status: DC
  Administered 2016-12-01: 3 mL via INTRAVENOUS

## 2016-12-01 MED ORDER — LEVALBUTEROL HCL 0.63 MG/3ML IN NEBU: 0.6300 mg | INHALATION_SOLUTION | Freq: Three times a day (TID) | RESPIRATORY_TRACT | Status: DC | PRN

## 2016-12-01 MED ORDER — MOVING RIGHT ALONG BOOK
Freq: Once | Status: AC
  Administered 2016-12-01: 10:00:00
  Filled 2016-12-01: qty 1

## 2016-12-01 MED ORDER — METHOCARBAMOL 500 MG PO TABS: 500.0000 mg | ORAL_TABLET | Freq: Four times a day (QID) | ORAL | Status: DC | PRN

## 2016-12-01 MED ORDER — FUROSEMIDE 40 MG PO TABS
40.0000 mg | ORAL_TABLET | Freq: Every day | ORAL | Status: DC
  Administered 2016-12-01: 40 mg via ORAL
  Filled 2016-12-01: qty 1

## 2016-12-01 MED ORDER — MAGNESIUM HYDROXIDE 400 MG/5ML PO SUSP: 30.0000 mL | Freq: Every day | ORAL | Status: DC | PRN

## 2016-12-02 LAB — TYPE AND SCREEN
ABO/RH(D): B POS
Antibody Screen: NEGATIVE
UNIT DIVISION: 0
Unit division: 0
Unit division: 0
Unit division: 0

## 2016-12-02 LAB — BPAM RBC
BLOOD PRODUCT EXPIRATION DATE: 201807302359
Blood Product Expiration Date: 201807282359
Blood Product Expiration Date: 201807292359
Blood Product Expiration Date: 201807292359
ISSUE DATE / TIME: 201807050821
ISSUE DATE / TIME: 201807061124
ISSUE DATE / TIME: 201807050821
UNIT TYPE AND RH: 7300
UNIT TYPE AND RH: 7300
Unit Type and Rh: 7300
Unit Type and Rh: 7300

## 2016-12-03 MED FILL — Medication: Qty: 2 | Status: AC

## 2016-12-26 NOTE — Progress Notes (Signed)
      [address]       [place] [address]             [phone]      Mrs. Kwiecinski had a sudden asystolic arrest this afternoon at 12:37 PM. She was on a walk when she became profoundly bradycardic. She went into asystole in her room. She was placed into bed and CPR initiated. The Code team initiated CPR and ACLS protocol. When I arrived CPR in progress with a dopplerable pulse. Paced rhythm at 90 but with no pulse. Resuscitation continued with CPR, epi and bicarbonate. She had a brief ROSC for 10-15 minutes and was in SR in the low 100s. Her BP was initially in the 160s but gradulally fell and was unresponsive to dopamine infusion and epinephrine boluses. There was probable seizure activity but she did not regan consciousness. She then had paced PEA again. Despite ongoing resuscitation efforts we were never able to achieve ROSC. Femoral venous and arterial lines were placed during the resuscitation and there was a good systolic pressure with compressions. She was profoundly hypoxic with sats in the 40s. Attempts were made to correct metabolic and respiratory acidoses. Despite all efforts she never regained a pulse. CPR was discontinued secondary to medical futility. Her pupils also became fixed and dilated near the end of the resuscitation.  Family notified. Offered to arrange an autopsy. They will consider  Revonda Standard. Roxan Hockey, MD [facility] [phone]

## 2016-12-26 NOTE — Progress Notes (Signed)
[facility] Progress Note Patient Name: [name] DOB: 09/07/1960 MRN: [id number]   Date of Service  12/12/16  HPI/Events of Note  Camara check postextubation. Patient resting comfortably. Appears to be watching TV. Respiratory rate 21. Saturation 100% on nasal cannula. Vitals otherwise stable.   eICU Interventions  Continued ICU monitoring postextubation.      Intervention Category Major Interventions: Respiratory failure - evaluation and management  Tera Partridge 2016-12-12, 12:26 AM

## 2016-12-26 NOTE — Death Summary Note (Signed)
DEATH SUMMARY   Patient Details  Name: [name] MRN: [id number] DOB: 11/09/1960  Admission/Discharge Information   Admit Date:  12/17/16  Date of Death:    Time of Death:    Length of Stay: 2  Referring Physician: Rogers Blocker, MD   Reason(s) for Hospitalization  Coronary artery disease  Diagnoses  Preliminary cause of death:  Secondary Diagnoses (including complications and co-morbidities):  Active Problems:   S/P CABG x 3   Brief Hospital Course (including significant findings, care, treatment, and services provided and events leading to death)  [name] is a 56 y.o. year old female with a history of bipolar disorder, post traumatic stress disorder, depression, cervical cancer, tobacco abuse and a remote CVA. She was evaluated for chest pain. Although a stress test was a low risk study she ultimately had catheterization which revealed 3 vessel CAD. She had severe stenoses involving the LAD and a nondominant RCA. She was offered CABG. She accepted the risks and agreed to proceed.  On 12/17/16 she underwent CABG x3. It was difficult to find suitable conduits but 3 grafts were able to be done using the LIMA and some vein from the upper thigh. She was hemodynamically stable postop. She remained intubated overnight due to hypercarbia but was extubated uneventfully the next day. She did have anemia requiring transfusion on POD # 1.  On the morning of POD # 2, She was having mild incisional discomfort but had no other complaints. She appeared to be doing extremely well. Transfer orders were placed.  On the afternoon of POD # 2 she was ambulating prior to her planned transfer. She had a sudden asystolic arrest and failed to recover despite ~ 1 1/2 hours of resuscitation attempts. She was pronounced dead at 2:03 PM    Pertinent Labs and Studies  Significant Diagnostic Studies Dg Chest 2 View  Result Date: 11/26/2016 CLINICAL DATA:  Preoperative evaluation for upcoming  cardiac bypass EXAM: CHEST  2 VIEW COMPARISON:  04/27/2016 FINDINGS: Cardiac shadow is mildly enlarged. Aortic calcifications are seen. The lungs are clear bilaterally. No acute bony abnormality is seen. IMPRESSION: No active cardiopulmonary disease. Electronically Signed   By: Inez Catalina M.D.   On: 11/26/2016 14:29   Dg Chest Port 1 View  Result Date: 12/19/2016 CLINICAL DATA:  Status post code. EXAM: PORTABLE CHEST 1 VIEW COMPARISON:  Earlier today at 0957 hours. FINDINGS: 1250 hours. Overlying external pacer/defibrillator. Prior median sternotomy. Interval intubation. Endotracheal tube is somewhat obscured by overlying hardware. Likely terminates either at the carina or entering the right mainstem bronchus. Normal heart size for level of inspiration. Probable small bilateral pleural effusions. No pneumothorax. Right-sided interstitial and airspace disease is progressive. Persistent left base atelectasis. Removal of right IJ Cordis sheath. IMPRESSION: Placement of endotracheal tube which is difficult to visualize centrally secondary to extensive overlying support apparatus. Felt to be either at the carina or entering the right mainstem bronchus. Consider retraction 4-5 cm. Worsened right-sided aeration which could represent asymmetric pulmonary edema or aspiration. Similar trace left pleural fluid with adjacent atelectasis. Electronically Signed   By: Abigail Miyamoto M.D.   On: 12-19-2016 13:36   Dg Chest Port 1 View  Result Date: 19-Dec-2016 CLINICAL DATA:  Postop day 2, status post CABG. EXAM: PORTABLE CHEST 1 VIEW COMPARISON:  Multiple exams, including 11/30/2016 FINDINGS: Interval removal of the Swan-Ganz catheter, mediastinal drain, endotracheal tube, and left chest tube. Epicardial pacer leads in right IJ introducer sheath are still in place.  Stable airspace opacity at the left lung base and mildly worsened airspace opacity at the right lung base. Suspected layering effusion on the left and possibly the  right. There is some resulting obscuration of the cardiac borders. Atherosclerotic calcification of the aortic arch. No edema.  Mild enlargement of the cardiopericardial silhouette. No pneumothorax. IMPRESSION: 1. Interval removal of tubes and lines ; the right IJ introducer sheath remains in place. Reduced lung volumes with stable left basilar any increased right basilar airspace opacity, some of which may be due to layering pleural effusions and passive atelectasis. There is cardiomegaly but no edema. No pneumothorax. 2.  Aortic Atherosclerosis (ICD10-I70.0). Electronically Signed   By: Van Clines M.D.   On: Dec 11, 2016 10:15   Dg Chest Port 1 View  Result Date: 11/30/2016 CLINICAL DATA:  Status post CABG x3, coronary artery disease EXAM: PORTABLE CHEST 1 VIEW COMPARISON:  November 29, 2016 FINDINGS: Endotracheal tube, nasogastric tube, mediastinal drain, right Swan-Ganz catheter and left-sided chest tubes are unchanged. There is mild atelectasis of left lung base with left pleural effusion. Patient status post prior median sternotomy and CABG. IMPRESSION: Postsurgical changes with life supporting devices unchanged compared prior exam. Mild atelectasis with pleural effusion of left lung base. Electronically Signed   By: Abelardo Diesel M.D.   On: 11/30/2016 07:42   Dg Chest Port 1 View  Result Date: 12/25/2016 CLINICAL DATA:  Status post coronary bypass grafting EXAM: PORTABLE CHEST 1 VIEW COMPARISON:  11/26/2016 FINDINGS: Cardiac shadow is mildly enlarged but stable. Postsurgical changes are now seen. Endotracheal tube, nasogastric catheter, mediastinal drain and left thoracostomy drain are noted. Swan-Ganz catheter is noted in the right pulmonary artery. Bibasilar atelectasis is noted left greater than right. No pneumothorax is noted. No acute bony abnormality is seen. IMPRESSION: Postsurgical changes with tubes and lines as described. Bibasilar atelectasis left greater than right. Electronically Signed    By: Inez Catalina M.D.   On: 12/02/2016 13:47    Microbiology Recent Results (from the past 240 hour(s))  Surgical pcr screen     Status: None   Collection Time: 11/26/16  2:29 PM  Result Value Ref Range Status   MRSA, PCR NEGATIVE NEGATIVE Final   Staphylococcus aureus NEGATIVE NEGATIVE Final    Comment:        The Xpert SA Assay (FDA approved for NASAL specimens in patients over 9 years of age), is one component of a comprehensive surveillance program.  Test performance has been validated by [facility] for patients greater than or equal to 37 year old. It is not intended to diagnose infection nor to guide or monitor treatment.     Lab Basic Metabolic Panel:  Recent Labs Lab 11/26/16 1353  12/06/2016 1223 11/30/2016 1338 12/21/2016 1927 12/23/2016 1934 11/30/16 0325 11/30/16 1601 11/30/16 1604 December 11, 2016 0845  NA 139  < > 143 145 143  --  140 140  --  136  K 3.6  < > 3.9 3.4* 4.5  --  3.6 4.2  --  4.2  CL 109  < > 108  --  109  --  112* 103  --  106  CO2 20*  --   --   --   --   --  23  --   --  23  GLUCOSE 91  < > 122* 112* 104*  --  105* 112*  --  108*  BUN 12  < > 12  --  11  --  9 12  --  16  CREATININE 0.83  < > 0.40*  --  0.60 0.68 0.71 0.80 0.85 1.14*  CALCIUM 9.3  --   --   --   --   --  8.3*  --   --  8.6*  MG  --   --   --   --   --  3.0* 2.3  --  2.2  --   < > = values in this interval not displayed. Liver Function Tests:  Recent Labs Lab 11/26/16 1353  AST 16  ALT 13*  ALKPHOS 66  BILITOT 0.7  PROT 6.7  ALBUMIN 3.9   No results for input(s): LIPASE, AMYLASE in the last 168 hours. No results for input(s): AMMONIA in the last 168 hours. CBC:  Recent Labs Lab 12/12/2016 1342  12/16/2016 1934 11/30/16 0325 11/30/16 1601 11/30/16 1604 25-Dec-2016 0845  WBC 10.5  --  10.4 10.6*  --  15.2* 17.3*  HGB 7.1*  < > 7.1* 6.6* 8.8* 7.7* 8.6*  HCT 22.4*  < > 22.1* 20.5* 26.0* 23.9* 26.1*  MCV 83.0  --  83.1 82.3  --  81.6 82.6  PLT 100*  --  125* 107*  --   99* 101*  < > = values in this interval not displayed. Cardiac Enzymes: No results for input(s): CKTOTAL, CKMB, CKMBINDEX, TROPONINI in the last 168 hours. Sepsis Labs:  Recent Labs Lab 12/20/2016 1934 11/30/16 0325 11/30/16 1604 December 25, 2016 0845  WBC 10.4 10.6* 15.2* 17.3*    Procedures/Operations  Coronary artery bypass grafting x 3.  [name] is a 56 yo woman with a history of bipolar disorder, post traumatic stress disorder, depression, cervical cancer, tobacco abuse and a remote CVA. She was evaluated for chest pain. Although a stress test was a low risk study she ultimately had catheterization which revealed 3 vessel CAD. She had severe stenoses involving the LAD and a nondominant RCA. She was offered CABG. She accepted the risks and agreed to proceed.  On 12/20/2016 she underwent CABG x3. It was difficult to find suitable conduits but 3 grafts were able to be done using the LIMA and some vein from the upper thigh. She was hemodynamically stable postop. She remained intubated overnight due to hypercarbia but was extubated uneventfully the next day. She did have anemia requiring transfusion on POD # 1.  On the morning of POD # 2, She was having mild incisional discomfort but had no other complaints. She appeared to be doing extremely well. Transfer orders were placed.  On the afternoon of POD # 2 she was ambulating prior to her planned transfer. She had a sudden asystolic arrest and failed to recover despite ~ 1 1/2 hours of resuscitation attempts. She was pronounced dead at 2:03 PM  Melrose Nakayama 12/25/16, 2:50 PM

## 2016-12-26 NOTE — Progress Notes (Signed)
Pt with orders to 2W.  Assisted to side of bed to dangle.  Ambulated x100 feet pushing wheelchair.   O2 at [facility].  After ambulating 50 feet,  pt requested to stop and rest, assisted to chair being pushed behind her.  Pt up to ambulate 50 feet back to room, and assisted to wheelchair to be transferred.  Upon sitting in wheelchair, pt nauseated, started to vomit (which appeared to be orange colored food).  Heart rate upon initially in wheelchair noted to be 90 bpm.  As pt started to vomit, heart rate progressively started to drop to 60 bpm.  At that time pacemaker immediately connected to pacing wires.  Heart rate 48 becoming non responsive at this time and eyes rolling back. Pacing wires connected and TPM turned on DDD rate of 90.  Dr Roxan Hockey notified.  See Code [employer].

## 2016-12-26 NOTE — Progress Notes (Signed)
2 Days Post-Op Procedure(s) (LRB): CORONARY ARTERY BYPASS GRAFTING (CABG) x 3, USING LEFT MAMMARY ARTERY AND RIGHT  GREATER SAPHENOUS VEIN (N/A) TRANSESOPHAGEAL ECHOCARDIOGRAM (TEE) (N/A) Subjective: Some incisional pain "not bad"  Objective: Vital signs in last 24 hours: Temp:  [98.6 F (37 C)-100.2 F (37.9 C)] 98.7 F (37.1 C) (07/07 0700) Pulse Rate:  [71-84] 80 (07/07 0700) Cardiac Rhythm: Normal sinus rhythm (07/06 2000) Resp:  [7-27] 7 (07/07 0700) BP: (89-146)/(55-73) 126/67 (07/07 0700) SpO2:  [94 %-100 %] 94 % (07/07 0845) Arterial Line BP: (102-169)/(35-64) 130/61 (07/06 2100) Weight:  [133 lb 14.4 oz (60.7 kg)] 133 lb 14.4 oz (60.7 kg) (07/07 0600)  Hemodynamic parameters for last 24 hours: PAP: (21-31)/(8-16) 26/10  Intake/Output from previous day: 07/06 0701 - 07/07 0700 In: 1742.1 [P.O.:180; I.V.:722.1; Blood:840] Out: 9163 [Urine:1300; Chest Tube:160] Intake/Output this shift: No intake/output data recorded.  General appearance: alert, cooperative and no distress Neurologic: intact Heart: regular rate and rhythm Lungs: diminished breath sounds bibasilar Abdomen: normal findings: soft, non-tender  Lab Results:  Recent Labs  11/30/16 0325 11/30/16 1601 11/30/16 1604  WBC 10.6*  --  15.2*  HGB 6.6* 8.8* 7.7*  HCT 20.5* 26.0* 23.9*  PLT 107*  --  99*   BMET:  Recent Labs  11/30/16 0325 11/30/16 1601 11/30/16 1604  NA 140 140  --   K 3.6 4.2  --   CL 112* 103  --   CO2 23  --   --   GLUCOSE 105* 112*  --   BUN 9 12  --   CREATININE 0.71 0.80 0.85  CALCIUM 8.3*  --   --     PT/INR:  Recent Labs  12/03/2016 1342  LABPROT 21.3*  INR 1.82   ABG    Component Value Date/Time   PHART 7.376 11/30/2016 0818   HCO3 22.5 11/30/2016 0818   TCO2 22 11/30/2016 1601   ACIDBASEDEF 2.0 11/30/2016 0818   O2SAT 97.0 11/30/2016 0818   CBG (last 3)   Recent Labs  2016-12-25 0001 12/25/16 0411 12/25/16 0818  GLUCAP 155* 97 106*     Assessment/Plan: S/P Procedure(s) (LRB): CORONARY ARTERY BYPASS GRAFTING (CABG) x 3, USING LEFT MAMMARY ARTERY AND RIGHT  GREATER SAPHENOUS VEIN (N/A) TRANSESOPHAGEAL ECHOCARDIOGRAM (TEE) (N/A) Plan for transfer to step-down: see transfer orders  POD # 2 doing well  CV- stable in SR  RESP_ continue IS, CXR pending  RENAL- creatinine and lytes OK  Volume overload secondary to 3rd spacing - diurese  ENDO- CBG well controlled- change to [facility] HS  Anemia- stable, follow  Transfer to [place]   LOS: 2 days    Melrose Nakayama 12/25/2016

## 2016-12-26 NOTE — Progress Notes (Signed)
Spoke with Genelle Bal ME on call at phone number [phone].  Dr. Kenton Kingfisher notified me that pt would not be an ME case.

## 2016-12-26 NOTE — Progress Notes (Signed)
   Dec 23, 2016 1240  Clinical Encounter Type  Visited With Patient and family together  Visit Type Code  Spiritual Encounters  Spiritual Needs Emotional;Prayer  Stress Factors  Patient Stress Factors Health changes  Family Stress Factors Family relationships  Responded to Code Blue. Sister and cousin present. Offered emotional support and prayer.

## 2016-12-26 NOTE — Progress Notes (Signed)
   Dec 28, 2016 1420  Clinical Encounter Type  Visited With Family  Visit Type Death  Spiritual Encounters  Spiritual Needs Prayer  Stress Factors  Patient Stress Factors Major life changes  Family Stress Factors Family relationships  Paged to [employer]. Prayed memorial prayer with family.

## 2016-12-26 NOTE — Anesthesia Procedure Notes (Signed)
Procedure Name: Intubation Date/Time: 12/22/2016 12:43 PM Performed by: Julieta Bellini Pre-anesthesia Checklist: Patient identified, Emergency Drugs available, Suction available and Patient being monitored Patient Re-evaluated:Patient Re-evaluated prior to inductionLaryngoscope Size: Mac and 4 Grade View: Grade I Tube type: Oral Tube size: 7.0 mm Number of attempts: 1 Airway Equipment and Method: Stylet and Oral airway Placement Confirmation: ETT inserted through vocal cords under direct vision,  positive ETCO2 and breath sounds checked- equal and bilateral Secured at: 23 cm Tube secured with: Tape Dental Injury: Teeth and Oropharynx as per pre-operative assessment

## 2016-12-26 NOTE — Code Documentation (Addendum)
  Patient Name: [name]   MRN: [id number]   Date of Birth/ Sex: 1961-01-02 , female      Admission Date: 12/20/2016  Attending Provider: Melrose Nakayama, MD  Primary Diagnosis: <principal problem not specified>   Indication: Pt was in her usual state of health until this PM, when she was ambulating with her nurse then became bradycardic and went into asystole. Code blue was subsequently called. At the time of arrival on scene, ACLS protocol was underway. The initial rhythm at that time was a paced narrow complex with no pulse.   Technical Description:  - CPR performance duration:  14 minutes  - Was defibrillation or cardioversion used? No   - Was external pacer placed? Yes  - Was patient intubated pre/post CPR? Yes   Medications Administered: Y = Yes; Blank = No Amiodarone    Atropine    Calcium    Epinephrine  Y  Lidocaine    Magnesium    Norepinephrine    Phenylephrine    Sodium bicarbonate  Y  Vasopressin     Post CPR evaluation:  - Final Status - Was patient successfully resuscitated ? Yes - What is current rhythm? Paced narrow complex rhythm - What is current hemodynamic status? Tenuous   Miscellaneous Information:  - Labs sent, including: CXR  - Primary team notified?  Yes  - Family Notified? Yes  - Additional notes/ transfer status: Dr. Roxan Hockey at bedside, Dopamine infusion started     Collier Salina, MD [facility] Resident Pager# [phone] 12/24/2016, 1:04 PM    Addendum: After initial ROSC she suffered repeat PEA and did not survive despite prolonged resuscitation efforts.

## 2016-12-26 NOTE — Progress Notes (Signed)
Spoke with [name] at [facility].  Referral number [phone].

## 2016-12-26 DEATH — deceased

## 2017-01-22 ENCOUNTER — Ambulatory Visit: Payer: PPO | Admitting: Cardiovascular Disease

## 2017-02-27 ENCOUNTER — Telehealth: Payer: Self-pay | Admitting: Cardiovascular Disease

## 2017-02-27 NOTE — Telephone Encounter (Signed)
New Message  Pt son call to f/u on insurance paper work that was faxed over on pt behalf. Please call back to discuss

## 2017-02-27 NOTE — Telephone Encounter (Signed)
Routed to medical records

## 2017-02-27 NOTE — Telephone Encounter (Signed)
Advised that we have not received any request for records from [employer]  He might should ask [employer] to [employer] and gave our fax number. lp
